# Patient Record
Sex: Female | Born: 1960
Health system: Southern US, Community
[De-identification: ages and names within clinical notes are randomized; demographics above are authoritative.]

## PROBLEM LIST (undated history)

## (undated) DIAGNOSIS — J40 Bronchitis, not specified as acute or chronic: Secondary | ICD-10-CM

## (undated) DIAGNOSIS — M542 Cervicalgia: Secondary | ICD-10-CM

## (undated) DIAGNOSIS — R51 Headache: Secondary | ICD-10-CM

## (undated) DIAGNOSIS — M1990 Unspecified osteoarthritis, unspecified site: Secondary | ICD-10-CM

## (undated) DIAGNOSIS — F329 Major depressive disorder, single episode, unspecified: Secondary | ICD-10-CM

## (undated) DIAGNOSIS — F32A Depression, unspecified: Secondary | ICD-10-CM

## (undated) DIAGNOSIS — T148XXA Other injury of unspecified body region, initial encounter: Secondary | ICD-10-CM

## (undated) DIAGNOSIS — K635 Polyp of colon: Secondary | ICD-10-CM

## (undated) DIAGNOSIS — G8929 Other chronic pain: Secondary | ICD-10-CM

## (undated) DIAGNOSIS — M549 Dorsalgia, unspecified: Secondary | ICD-10-CM

## (undated) DIAGNOSIS — F410 Panic disorder [episodic paroxysmal anxiety] without agoraphobia: Secondary | ICD-10-CM

## (undated) DIAGNOSIS — D649 Anemia, unspecified: Secondary | ICD-10-CM

## (undated) DIAGNOSIS — R519 Headache, unspecified: Secondary | ICD-10-CM

## (undated) HISTORY — DX: Depression, unspecified: F32.A

## (undated) HISTORY — PX: HEMORRHOID SURGERY: SHX153

## (undated) HISTORY — PX: ABLATION ON ENDOMETRIOSIS: SHX5787

## (undated) HISTORY — PX: POLYPECTOMY: SHX149

## (undated) HISTORY — DX: Major depressive disorder, single episode, unspecified: F32.9

## (undated) HISTORY — DX: Polyp of colon: K63.5

## (undated) HISTORY — PX: TONSILLECTOMY: SUR1361

## (undated) HISTORY — DX: Unspecified osteoarthritis, unspecified site: M19.90

## (undated) HISTORY — PX: COLONOSCOPY: SHX174

## (undated) HISTORY — PX: KIDNEY SURGERY: SHX687

## (undated) HISTORY — PX: BREAST CYST EXCISION: SHX579

## (undated) HISTORY — DX: Anemia, unspecified: D64.9

---

## 2000-12-17 HISTORY — PX: NECK SURGERY: SHX720

## 2003-11-22 ENCOUNTER — Emergency Department (HOSPITAL_COMMUNITY): Admission: EM | Admit: 2003-11-22 | Discharge: 2003-11-22 | Payer: Self-pay | Admitting: Emergency Medicine

## 2005-10-15 ENCOUNTER — Ambulatory Visit: Payer: Self-pay | Admitting: Family Medicine

## 2009-06-30 ENCOUNTER — Emergency Department (HOSPITAL_BASED_OUTPATIENT_CLINIC_OR_DEPARTMENT_OTHER): Admission: EM | Admit: 2009-06-30 | Discharge: 2009-06-30 | Payer: Self-pay | Admitting: Emergency Medicine

## 2009-06-30 ENCOUNTER — Ambulatory Visit: Payer: Self-pay | Admitting: Radiology

## 2009-07-23 ENCOUNTER — Emergency Department (HOSPITAL_BASED_OUTPATIENT_CLINIC_OR_DEPARTMENT_OTHER): Admission: EM | Admit: 2009-07-23 | Discharge: 2009-07-23 | Payer: Self-pay | Admitting: Emergency Medicine

## 2009-07-27 ENCOUNTER — Emergency Department (HOSPITAL_BASED_OUTPATIENT_CLINIC_OR_DEPARTMENT_OTHER): Admission: EM | Admit: 2009-07-27 | Discharge: 2009-07-27 | Payer: Self-pay | Admitting: Emergency Medicine

## 2009-09-24 ENCOUNTER — Emergency Department (HOSPITAL_BASED_OUTPATIENT_CLINIC_OR_DEPARTMENT_OTHER): Admission: EM | Admit: 2009-09-24 | Discharge: 2009-09-24 | Payer: Self-pay | Admitting: Emergency Medicine

## 2009-10-19 ENCOUNTER — Emergency Department (HOSPITAL_BASED_OUTPATIENT_CLINIC_OR_DEPARTMENT_OTHER): Admission: EM | Admit: 2009-10-19 | Discharge: 2009-10-19 | Payer: Self-pay | Admitting: Emergency Medicine

## 2009-12-08 ENCOUNTER — Emergency Department (HOSPITAL_BASED_OUTPATIENT_CLINIC_OR_DEPARTMENT_OTHER): Admission: EM | Admit: 2009-12-08 | Discharge: 2009-12-08 | Payer: Self-pay | Admitting: Emergency Medicine

## 2010-02-18 ENCOUNTER — Emergency Department (HOSPITAL_BASED_OUTPATIENT_CLINIC_OR_DEPARTMENT_OTHER): Admission: EM | Admit: 2010-02-18 | Discharge: 2010-02-18 | Payer: Self-pay | Admitting: Emergency Medicine

## 2010-06-10 ENCOUNTER — Emergency Department (HOSPITAL_BASED_OUTPATIENT_CLINIC_OR_DEPARTMENT_OTHER): Admission: EM | Admit: 2010-06-10 | Discharge: 2010-06-10 | Payer: Self-pay | Admitting: Emergency Medicine

## 2010-08-01 ENCOUNTER — Emergency Department (HOSPITAL_BASED_OUTPATIENT_CLINIC_OR_DEPARTMENT_OTHER): Admission: EM | Admit: 2010-08-01 | Discharge: 2010-08-01 | Payer: Self-pay | Admitting: Emergency Medicine

## 2010-11-26 ENCOUNTER — Emergency Department (HOSPITAL_BASED_OUTPATIENT_CLINIC_OR_DEPARTMENT_OTHER)
Admission: EM | Admit: 2010-11-26 | Discharge: 2010-11-26 | Payer: Self-pay | Source: Home / Self Care | Admitting: Emergency Medicine

## 2010-12-06 ENCOUNTER — Emergency Department (HOSPITAL_BASED_OUTPATIENT_CLINIC_OR_DEPARTMENT_OTHER)
Admission: EM | Admit: 2010-12-06 | Discharge: 2010-12-06 | Payer: Self-pay | Source: Home / Self Care | Admitting: Emergency Medicine

## 2010-12-28 ENCOUNTER — Emergency Department (HOSPITAL_BASED_OUTPATIENT_CLINIC_OR_DEPARTMENT_OTHER)
Admission: EM | Admit: 2010-12-28 | Discharge: 2010-12-28 | Payer: Self-pay | Source: Home / Self Care | Admitting: Emergency Medicine

## 2011-01-01 LAB — URINE MICROSCOPIC-ADD ON

## 2011-01-01 LAB — DIFFERENTIAL
Basophils Absolute: 0 10*3/uL (ref 0.0–0.1)
Basophils Relative: 0 % (ref 0–1)
Eosinophils Absolute: 0.1 10*3/uL (ref 0.0–0.7)
Eosinophils Relative: 2 % (ref 0–5)
Lymphocytes Relative: 31 % (ref 12–46)
Lymphs Abs: 2.6 10*3/uL (ref 0.7–4.0)
Monocytes Absolute: 0.5 10*3/uL (ref 0.1–1.0)
Monocytes Relative: 6 % (ref 3–12)
Neutro Abs: 5.2 10*3/uL (ref 1.7–7.7)
Neutrophils Relative %: 61 % (ref 43–77)

## 2011-01-01 LAB — CBC
HCT: 47.5 % — ABNORMAL HIGH (ref 36.0–46.0)
Hemoglobin: 16.6 g/dL — ABNORMAL HIGH (ref 12.0–15.0)
MCH: 30.5 pg (ref 26.0–34.0)
MCHC: 34.9 g/dL (ref 30.0–36.0)
MCV: 87.3 fL (ref 78.0–100.0)
Platelets: 295 10*3/uL (ref 150–400)
RBC: 5.44 MIL/uL — ABNORMAL HIGH (ref 3.87–5.11)
RDW: 16.4 % — ABNORMAL HIGH (ref 11.5–15.5)
WBC: 8.5 10*3/uL (ref 4.0–10.5)

## 2011-01-01 LAB — BASIC METABOLIC PANEL
BUN: 23 mg/dL (ref 6–23)
CO2: 22 mEq/L (ref 19–32)
Calcium: 10.5 mg/dL (ref 8.4–10.5)
Chloride: 110 mEq/L (ref 96–112)
Creatinine, Ser: 0.8 mg/dL (ref 0.4–1.2)
GFR calc Af Amer: >60 mL/min (ref >60)
GFR calc non Af Amer: >60 mL/min (ref >60)
Glucose, Bld: 95 mg/dL (ref 70–99)
Potassium: 5 mEq/L (ref 3.5–5.1)
Sodium: 148 mEq/L — ABNORMAL HIGH (ref 135–145)

## 2011-01-01 LAB — URINALYSIS, ROUTINE W REFLEX MICROSCOPIC
Bilirubin Urine: NEGATIVE
Hgb urine dipstick: NEGATIVE
Ketones, ur: NEGATIVE mg/dL
Nitrite: NEGATIVE
Protein, ur: NEGATIVE mg/dL
Specific Gravity, Urine: 1.026 (ref 1.005–1.030)
Urine Glucose, Fasting: NEGATIVE mg/dL
Urobilinogen, UA: 0.2 mg/dL (ref 0.0–1.0)
pH: 6.5 (ref 5.0–8.0)

## 2011-01-01 LAB — LIPASE, BLOOD: Lipase: 239 U/L (ref 23–300)

## 2011-01-11 LAB — URINE CULTURE
Colony Count: 65000
Culture  Setup Time: 201201130112

## 2011-02-22 ENCOUNTER — Emergency Department (HOSPITAL_BASED_OUTPATIENT_CLINIC_OR_DEPARTMENT_OTHER)
Admission: EM | Admit: 2011-02-22 | Discharge: 2011-02-22 | Disposition: A | Discharge status: Home / Self Care | Payer: BC Managed Care – PPO | Attending: Emergency Medicine | Admitting: Emergency Medicine

## 2011-02-22 DIAGNOSIS — F17200 Nicotine dependence, unspecified, uncomplicated: Secondary | ICD-10-CM | POA: Insufficient documentation

## 2011-02-22 DIAGNOSIS — IMO0002 Reserved for concepts with insufficient information to code with codable children: Secondary | ICD-10-CM | POA: Insufficient documentation

## 2011-02-22 DIAGNOSIS — G8929 Other chronic pain: Secondary | ICD-10-CM | POA: Insufficient documentation

## 2011-02-22 DIAGNOSIS — M542 Cervicalgia: Secondary | ICD-10-CM | POA: Insufficient documentation

## 2011-02-26 LAB — URINALYSIS, ROUTINE W REFLEX MICROSCOPIC
Glucose, UA: NEGATIVE mg/dL
Hgb urine dipstick: NEGATIVE
Ketones, ur: 15 mg/dL — AB
Nitrite: NEGATIVE
Protein, ur: 30 mg/dL — AB
Specific Gravity, Urine: 1.027 (ref 1.005–1.030)
Urobilinogen, UA: 1 mg/dL (ref 0.0–1.0)
pH: 6 (ref 5.0–8.0)

## 2011-02-26 LAB — POCT I-STAT 3, ART BLOOD GAS (G3+)
Acid-Base Excess: 2 mmol/L (ref 0.0–2.0)
Bicarbonate: 29.1 mEq/L — ABNORMAL HIGH (ref 20.0–24.0)
O2 Saturation: 90 %
Patient temperature: 98.3
TCO2: 31 mmol/L (ref 0–100)
pCO2 arterial: 55.6 mmHg — ABNORMAL HIGH (ref 35.0–45.0)
pH, Arterial: 7.326 — ABNORMAL LOW (ref 7.350–7.400)
pO2, Arterial: 63 mmHg — ABNORMAL LOW (ref 80.0–100.0)

## 2011-02-26 LAB — URINE MICROSCOPIC-ADD ON

## 2011-02-27 LAB — COMPREHENSIVE METABOLIC PANEL
ALT: 28 U/L (ref 0–35)
AST: 25 U/L (ref 0–37)
Albumin: 3.6 g/dL (ref 3.5–5.2)
Alkaline Phosphatase: 74 U/L (ref 39–117)
BUN: 8 mg/dL (ref 6–23)
CO2: 24 mEq/L (ref 19–32)
Calcium: 9.7 mg/dL (ref 8.4–10.5)
Chloride: 114 mEq/L — ABNORMAL HIGH (ref 96–112)
Creatinine, Ser: 0.8 mg/dL (ref 0.4–1.2)
GFR calc Af Amer: >60 mL/min (ref >60)
GFR calc non Af Amer: >60 mL/min (ref >60)
Glucose, Bld: 82 mg/dL (ref 70–99)
Potassium: 3.4 mEq/L — ABNORMAL LOW (ref 3.5–5.1)
Sodium: 147 mEq/L — ABNORMAL HIGH (ref 135–145)
Total Bilirubin: 0.5 mg/dL (ref 0.3–1.2)
Total Protein: 6.1 g/dL (ref 6.0–8.3)

## 2011-02-27 LAB — URINALYSIS, ROUTINE W REFLEX MICROSCOPIC
Bilirubin Urine: NEGATIVE
Glucose, UA: NEGATIVE mg/dL
Ketones, ur: NEGATIVE mg/dL
Nitrite: NEGATIVE
Protein, ur: NEGATIVE mg/dL
Specific Gravity, Urine: 1.017 (ref 1.005–1.030)
Urobilinogen, UA: 1 mg/dL (ref 0.0–1.0)
pH: 6.5 (ref 5.0–8.0)

## 2011-02-27 LAB — CBC
HCT: 40.6 % (ref 36.0–46.0)
Hemoglobin: 13.7 g/dL (ref 12.0–15.0)
MCH: 30.1 pg (ref 26.0–34.0)
MCHC: 33.7 g/dL (ref 30.0–36.0)
MCV: 89.2 fL (ref 78.0–100.0)
Platelets: 255 10*3/uL (ref 150–400)
RBC: 4.55 MIL/uL (ref 3.87–5.11)
RDW: 16.1 % — ABNORMAL HIGH (ref 11.5–15.5)
WBC: 7.4 10*3/uL (ref 4.0–10.5)

## 2011-02-27 LAB — URINE MICROSCOPIC-ADD ON

## 2011-02-27 LAB — DIFFERENTIAL
Basophils Absolute: 0 10*3/uL (ref 0.0–0.1)
Basophils Relative: 0 % (ref 0–1)
Eosinophils Absolute: 0.3 10*3/uL (ref 0.0–0.7)
Eosinophils Relative: 4 % (ref 0–5)
Lymphocytes Relative: 22 % (ref 12–46)
Lymphs Abs: 1.6 10*3/uL (ref 0.7–4.0)
Monocytes Absolute: 0.4 10*3/uL (ref 0.1–1.0)
Monocytes Relative: 5 % (ref 3–12)
Neutro Abs: 5 10*3/uL (ref 1.7–7.7)
Neutrophils Relative %: 68 % (ref 43–77)

## 2011-02-27 LAB — LIPASE, BLOOD: Lipase: 119 U/L (ref 23–300)

## 2011-03-24 LAB — CBC
HCT: 40.2 % (ref 36.0–46.0)
Hemoglobin: 13.8 g/dL (ref 12.0–15.0)
MCHC: 34.4 g/dL (ref 30.0–36.0)
MCV: 92.6 fL (ref 78.0–100.0)
Platelets: 186 10*3/uL (ref 150–400)
RBC: 4.34 MIL/uL (ref 3.87–5.11)
RDW: 13.8 % (ref 11.5–15.5)
WBC: 6.2 10*3/uL (ref 4.0–10.5)

## 2011-03-24 LAB — POCT TOXICOLOGY PANEL: Opiates: POSITIVE

## 2011-03-24 LAB — DIFFERENTIAL
Basophils Absolute: 0 10*3/uL (ref 0.0–0.1)
Basophils Relative: 1 % (ref 0–1)
Eosinophils Absolute: 0.2 10*3/uL (ref 0.0–0.7)
Eosinophils Relative: 3 % (ref 0–5)
Lymphocytes Relative: 37 % (ref 12–46)
Lymphs Abs: 2.3 10*3/uL (ref 0.7–4.0)
Monocytes Absolute: 0.5 10*3/uL (ref 0.1–1.0)
Monocytes Relative: 8 % (ref 3–12)
Neutro Abs: 3.2 10*3/uL (ref 1.7–7.7)
Neutrophils Relative %: 52 % (ref 43–77)

## 2011-03-24 LAB — URINALYSIS, ROUTINE W REFLEX MICROSCOPIC
Glucose, UA: NEGATIVE mg/dL
Ketones, ur: NEGATIVE mg/dL
Protein, ur: NEGATIVE mg/dL

## 2011-03-24 LAB — COMPREHENSIVE METABOLIC PANEL
ALT: 8 U/L (ref 0–35)
AST: 25 U/L (ref 0–37)
Albumin: 3.7 g/dL (ref 3.5–5.2)
Alkaline Phosphatase: 72 U/L (ref 39–117)
BUN: 15 mg/dL (ref 6–23)
CO2: 27 mEq/L (ref 19–32)
Calcium: 9.5 mg/dL (ref 8.4–10.5)
Chloride: 110 mEq/L (ref 96–112)
Creatinine, Ser: 0.9 mg/dL (ref 0.4–1.2)
GFR calc Af Amer: >60 mL/min (ref >60)
GFR calc non Af Amer: >60 mL/min (ref >60)
Glucose, Bld: 65 mg/dL — ABNORMAL LOW (ref 70–99)
Potassium: 4.4 mEq/L (ref 3.5–5.1)
Sodium: 142 mEq/L (ref 135–145)
Total Bilirubin: 0.3 mg/dL (ref 0.3–1.2)
Total Protein: 6.3 g/dL (ref 6.0–8.3)

## 2011-03-24 LAB — URINE MICROSCOPIC-ADD ON

## 2011-03-24 LAB — ETHANOL: Alcohol, Ethyl (B): <5 mg/dL (ref 0–10)

## 2011-03-24 LAB — ACETAMINOPHEN LEVEL
Acetaminophen (Tylenol), Serum: 19 ug/mL (ref 10–30)
Acetaminophen (Tylenol), Serum: <10 ug/mL — ABNORMAL LOW (ref 10–30)

## 2011-05-20 ENCOUNTER — Emergency Department (HOSPITAL_BASED_OUTPATIENT_CLINIC_OR_DEPARTMENT_OTHER)
Admission: EM | Admit: 2011-05-20 | Discharge: 2011-05-20 | Disposition: A | Discharge status: Home / Self Care | Payer: BC Managed Care – PPO | Attending: Emergency Medicine | Admitting: Emergency Medicine

## 2011-05-20 DIAGNOSIS — G8929 Other chronic pain: Secondary | ICD-10-CM | POA: Insufficient documentation

## 2011-05-20 DIAGNOSIS — R112 Nausea with vomiting, unspecified: Secondary | ICD-10-CM | POA: Insufficient documentation

## 2011-05-20 DIAGNOSIS — IMO0002 Reserved for concepts with insufficient information to code with codable children: Secondary | ICD-10-CM | POA: Insufficient documentation

## 2011-05-20 LAB — BASIC METABOLIC PANEL
CO2: 22 mEq/L (ref 19–32)
Calcium: 10.5 mg/dL (ref 8.4–10.5)
Chloride: 105 mEq/L (ref 96–112)
Glucose, Bld: 83 mg/dL (ref 70–99)
Potassium: 3.8 mEq/L (ref 3.5–5.1)
Sodium: 141 mEq/L (ref 135–145)

## 2011-05-20 LAB — URINALYSIS, ROUTINE W REFLEX MICROSCOPIC
Ketones, ur: 15 mg/dL — AB
Nitrite: NEGATIVE
Urobilinogen, UA: 1 mg/dL (ref 0.0–1.0)
pH: 6 (ref 5.0–8.0)

## 2011-05-20 LAB — URINE MICROSCOPIC-ADD ON

## 2011-07-16 ENCOUNTER — Emergency Department (HOSPITAL_BASED_OUTPATIENT_CLINIC_OR_DEPARTMENT_OTHER)
Admission: EM | Admit: 2011-07-16 | Discharge: 2011-07-16 | Disposition: A | Discharge status: Home / Self Care | Payer: BC Managed Care – PPO | Attending: Emergency Medicine | Admitting: Emergency Medicine

## 2011-07-16 ENCOUNTER — Encounter: Payer: Self-pay | Admitting: *Deleted

## 2011-07-16 DIAGNOSIS — R109 Unspecified abdominal pain: Secondary | ICD-10-CM | POA: Insufficient documentation

## 2011-07-16 DIAGNOSIS — R197 Diarrhea, unspecified: Secondary | ICD-10-CM

## 2011-07-16 DIAGNOSIS — R112 Nausea with vomiting, unspecified: Secondary | ICD-10-CM | POA: Insufficient documentation

## 2011-07-16 DIAGNOSIS — G8929 Other chronic pain: Secondary | ICD-10-CM | POA: Insufficient documentation

## 2011-07-16 HISTORY — DX: Other chronic pain: G89.29

## 2011-07-16 HISTORY — DX: Cervicalgia: M54.2

## 2011-07-16 LAB — COMPREHENSIVE METABOLIC PANEL
ALT: 16 U/L (ref 0–35)
AST: 21 U/L (ref 0–37)
Alkaline Phosphatase: 99 U/L (ref 39–117)
CO2: 23 mEq/L (ref 19–32)
GFR calc Af Amer: >60 mL/min (ref >60)
GFR calc non Af Amer: >60 mL/min (ref >60)
Glucose, Bld: 88 mg/dL (ref 70–99)
Potassium: 3.8 mEq/L (ref 3.5–5.1)
Sodium: 140 mEq/L (ref 135–145)

## 2011-07-16 LAB — URINALYSIS, ROUTINE W REFLEX MICROSCOPIC
Bilirubin Urine: NEGATIVE
Glucose, UA: NEGATIVE mg/dL
Hgb urine dipstick: NEGATIVE
Specific Gravity, Urine: 1.03 (ref 1.005–1.030)
pH: 6 (ref 5.0–8.0)

## 2011-07-16 LAB — CBC
Hemoglobin: 15.6 g/dL — ABNORMAL HIGH (ref 12.0–15.0)
Platelets: 384 10*3/uL (ref 150–400)
RBC: 5.12 MIL/uL — ABNORMAL HIGH (ref 3.87–5.11)
WBC: 10.6 10*3/uL — ABNORMAL HIGH (ref 4.0–10.5)

## 2011-07-16 LAB — URINE MICROSCOPIC-ADD ON

## 2011-07-16 MED ORDER — ONDANSETRON HCL 4 MG/2ML IJ SOLN
4.0000 mg | Freq: Once | INTRAMUSCULAR | Status: AC
  Administered 2011-07-16: 4 mg via INTRAVENOUS
  Filled 2011-07-16: qty 2

## 2011-07-16 MED ORDER — DIPHENOXYLATE-ATROPINE 2.5-0.025 MG PO TABS
2.0000 | ORAL_TABLET | Freq: Once | ORAL | Status: AC
  Administered 2011-07-16: 2 via ORAL
  Filled 2011-07-16: qty 2

## 2011-07-16 MED ORDER — SODIUM CHLORIDE 0.9 % IV BOLUS (SEPSIS): 1000.0000 mL | Freq: Once | INTRAVENOUS | Status: DC

## 2011-07-16 MED ORDER — SODIUM CHLORIDE 0.9 % IV BOLUS (SEPSIS)
1000.0000 mL | Freq: Once | INTRAVENOUS | Status: AC
  Administered 2011-07-16: 1000 mL via INTRAVENOUS

## 2011-07-16 MED ORDER — ONDANSETRON HCL 4 MG PO TABS: 4.0000 mg | ORAL_TABLET | Freq: Four times a day (QID) | ORAL | Status: AC

## 2011-07-16 NOTE — ED Notes (Signed)
Pt c/o loose stools x 4 days with nausea.

## 2011-07-16 NOTE — ED Provider Notes (Addendum)
History     Chief Complaint  Patient presents with  . Diarrhea   Patient is a 50 y.o. female presenting with diarrhea. The history is provided by the patient.  Diarrhea The primary symptoms include fever, fatigue, abdominal pain, nausea, vomiting, diarrhea and myalgias. The illness began 3 to 5 days ago. The onset was gradual. The problem has not changed since onset. The abdominal pain began more than 2 days ago. The abdominal pain is generalized. The abdominal pain does not radiate. The severity of the abdominal pain is 2/10.  Nausea began 3 to 5 days ago. The nausea is associated with eating. The nausea is exacerbated by food.  The diarrhea began 3 to 5 days ago. The diarrhea is watery. The diarrhea occurs 2 to 4 times per day.  The illness is also significant for chills. Associated medical issues do not include inflammatory bowel disease, GERD, alcohol abuse or irritable bowel syndrome.    Past Medical History  Diagnosis Date  . Neck pain, chronic     Past Surgical History  Procedure Date  . Neck surgery   . Tonsillectomy     History reviewed. No pertinent family history.  History  Substance Use Topics  . Smoking status: Current Everyday Smoker -- 1.0 packs/day  . Smokeless tobacco: Not on file  . Alcohol Use: No    OB History    Grav Para Term Preterm Abortions TAB SAB Ect Mult Living                  Review of Systems  Constitutional: Positive for fever, chills and fatigue.  Gastrointestinal: Positive for nausea, vomiting, abdominal pain and diarrhea.  Musculoskeletal: Positive for myalgias.  All other systems reviewed and are negative.    Physical Exam  BP 109/58  Pulse 70  Temp(Src) 98.4 F (36.9 C) (Oral)  Resp 16  Wt 120 lb (54.432 kg)  SpO2 99%  Physical Exam  Constitutional: She is oriented to person, place, and time. She appears well-developed and well-nourished.  HENT:  Head: Normocephalic and atraumatic.  Eyes: Conjunctivae and EOM are  normal. Pupils are equal, round, and reactive to light.  Neck: Normal range of motion. Neck supple.  Cardiovascular: Normal rate.   Pulmonary/Chest: Effort normal and breath sounds normal.  Abdominal: Soft. Bowel sounds are normal. She exhibits no distension and no mass. There is no tenderness. There is no rebound and no guarding.  Musculoskeletal: Normal range of motion.  Neurological: She is alert and oriented to person, place, and time. She has normal reflexes.  Skin: Skin is warm and dry.  Psychiatric: She has a normal mood and affect.    ED Course  Procedures  MDM Pt feels better after fluids and medications.  I advised lomotil x 24 hours,  Zofran po,  Encourage fluids,  Brat diet.  Medical screening examination/treatment/procedure(s) were performed by non-physician practitioner and as supervising physician I was immediately available for consultation/collaboration. Osvaldo Human, M.D.    [place], [place] 07/16/11 2113  Carleene Cooper III, MD 07/17/11 1050  Carleene Cooper III, MD 08/24/11 [phone]

## 2011-07-16 NOTE — ED Notes (Signed)
Reviewed case with Verline Lema, PA and agreeable to UA and NS 1000cc bolus-pt requested pain med-advised of need to be seen by EDP prior to med admn

## 2011-07-16 NOTE — ED Notes (Signed)
Pt denies n/v/d since meds given-states she feels better and ready to go

## 2011-10-12 ENCOUNTER — Encounter (HOSPITAL_BASED_OUTPATIENT_CLINIC_OR_DEPARTMENT_OTHER): Payer: Self-pay | Admitting: Family Medicine

## 2011-10-12 ENCOUNTER — Emergency Department (HOSPITAL_BASED_OUTPATIENT_CLINIC_OR_DEPARTMENT_OTHER)
Admission: EM | Admit: 2011-10-12 | Discharge: 2011-10-12 | Disposition: A | Discharge status: Home / Self Care | Payer: BC Managed Care – PPO | Attending: Emergency Medicine | Admitting: Emergency Medicine

## 2011-10-12 DIAGNOSIS — R51 Headache: Secondary | ICD-10-CM

## 2011-10-12 DIAGNOSIS — F17200 Nicotine dependence, unspecified, uncomplicated: Secondary | ICD-10-CM | POA: Insufficient documentation

## 2011-10-12 DIAGNOSIS — R197 Diarrhea, unspecified: Secondary | ICD-10-CM | POA: Insufficient documentation

## 2011-10-12 HISTORY — DX: Other injury of unspecified body region, initial encounter: T14.8XXA

## 2011-10-12 HISTORY — DX: Headache: R51

## 2011-10-12 HISTORY — DX: Headache, unspecified: R51.9

## 2011-10-12 HISTORY — DX: Other chronic pain: G89.29

## 2011-10-12 LAB — BASIC METABOLIC PANEL
BUN: 10 mg/dL (ref 6–23)
Calcium: 10.3 mg/dL (ref 8.4–10.5)
Chloride: 107 mEq/L (ref 96–112)
Creatinine, Ser: 0.6 mg/dL (ref 0.50–1.10)
GFR calc Af Amer: >90 mL/min (ref >90)
GFR calc non Af Amer: >90 mL/min (ref >90)

## 2011-10-12 LAB — DIFFERENTIAL
Basophils Absolute: 0 10*3/uL (ref 0.0–0.1)
Basophils Relative: 0 % (ref 0–1)
Eosinophils Absolute: 0.1 10*3/uL (ref 0.0–0.7)
Monocytes Absolute: 0.6 10*3/uL (ref 0.1–1.0)
Monocytes Relative: 6 % (ref 3–12)
Neutro Abs: 5.9 10*3/uL (ref 1.7–7.7)
Neutrophils Relative %: 59 % (ref 43–77)

## 2011-10-12 LAB — CBC
HCT: 43.6 % (ref 36.0–46.0)
Hemoglobin: 15.2 g/dL — ABNORMAL HIGH (ref 12.0–15.0)
MCH: 30.2 pg (ref 26.0–34.0)
MCHC: 34.9 g/dL (ref 30.0–36.0)
RDW: 16.4 % — ABNORMAL HIGH (ref 11.5–15.5)

## 2011-10-12 MED ORDER — DIPHENHYDRAMINE HCL 50 MG/ML IJ SOLN
25.0000 mg | Freq: Once | INTRAMUSCULAR | Status: DC
  Filled 2011-10-12: qty 1

## 2011-10-12 MED ORDER — DROPERIDOL 2.5 MG/ML IJ SOLN
2.5000 mg | Freq: Once | INTRAMUSCULAR | Status: DC
  Filled 2011-10-12: qty 2

## 2011-10-12 MED ORDER — SODIUM CHLORIDE 0.9 % IV BOLUS (SEPSIS)
1000.0000 mL | Freq: Once | INTRAVENOUS | Status: AC
  Administered 2011-10-12: 1000 mL via INTRAVENOUS

## 2011-10-12 MED ORDER — OXYCODONE-ACETAMINOPHEN 5-325 MG PO TABS: 2.0000 | ORAL_TABLET | ORAL | Status: AC | PRN

## 2011-10-12 NOTE — ED Provider Notes (Addendum)
History     CSN: [id number] Arrival date & time: 10/12/2011  5:05 PM   First MD Initiated Contact with Patient 10/12/11 1741      Chief Complaint  Patient presents with  . Headache  . Diarrhea     HPI Pt c/o diarrhea and headaches x 1 wk. Pt reports h/o headaches in past and sts h/a feels similar to ones in past.  Past Medical History  Diagnosis Date  . Neck pain, chronic   . Chronic headaches   . Nerve damage     Past Surgical History  Procedure Date  . Neck surgery   . Tonsillectomy     No family history on file.  History  Substance Use Topics  . Smoking status: Current Everyday Smoker -- 1.0 packs/day  . Smokeless tobacco: Not on file  . Alcohol Use: No    OB History    Grav Para Term Preterm Abortions TAB SAB Ect Mult Living                  Review of Systems  All other systems reviewed and are negative.    Allergies  Anaprox  Home Medications   Current Outpatient Rx  Name Route Sig Dispense Refill  . DULOXETINE HCL 60 MG PO CPEP Oral Take 60 mg by mouth daily.      . IBUPROFEN 200 MG PO TABS Oral Take 400 mg by mouth every 6 (six) hours as needed. For pain     . LIDOCAINE 5 % EX PTCH Transdermal Place 1 patch onto the skin daily. Remove & Discard patch within 12 hours or as directed by MD     . TIZANIDINE HCL 2 MG PO CAPS Oral Take 2 mg by mouth 3 (three) times daily.      . OXYCODONE-ACETAMINOPHEN 5-325 MG PO TABS Oral Take 2 tablets by mouth every 4 (four) hours as needed for pain. 6 tablet 0    BP 113/88  Pulse 72  Temp(Src) 98.2 F (36.8 C) (Oral)  Resp 16  SpO2 99%  Physical Exam  HENT:  Head: Normocephalic.  Eyes: Pupils are equal, round, and reactive to light.  Neck: Normal range of motion. No thyromegaly present.  Cardiovascular: Normal rate.   Pulmonary/Chest: Effort normal.  Abdominal: Soft. Bowel sounds are normal. She exhibits no mass. There is no rebound and no guarding.  Skin: Skin is warm and dry.  Psychiatric: She  has a normal mood and affect.    ED Course  Procedures (including critical care time) Patient given 1 L of IV fluids.  Was unable to give other medication the patient she was driving her self.  No real change in her headache has had no significant diarrhea. Labs Reviewed  CBC - Abnormal; Notable for the following:    Hemoglobin 15.2 (*)    RDW 16.4 (*)    All other components within normal limits  DIFFERENTIAL  BASIC METABOLIC PANEL   No results found.   1. Headache   2. Diarrhea       MDM          Nelia Shi, MD 10/12/11 2039  Nelia Shi, MD 10/12/11 2040

## 2011-10-12 NOTE — ED Notes (Signed)
Pt states doesn't have a ride home,meds withheld and discussed with Dr Radford Pax

## 2011-10-12 NOTE — ED Notes (Signed)
Pt c/o diarrhea and headaches x 1 wk. Pt reports h/o headaches in past and sts h/a feels similar to ones in past.

## 2012-02-01 ENCOUNTER — Emergency Department (HOSPITAL_BASED_OUTPATIENT_CLINIC_OR_DEPARTMENT_OTHER)
Admission: EM | Admit: 2012-02-01 | Discharge: 2012-02-01 | Disposition: A | Discharge status: Home / Self Care | Payer: BC Managed Care – PPO | Attending: Emergency Medicine | Admitting: Emergency Medicine

## 2012-02-01 ENCOUNTER — Other Ambulatory Visit: Payer: Self-pay

## 2012-02-01 ENCOUNTER — Encounter (HOSPITAL_BASED_OUTPATIENT_CLINIC_OR_DEPARTMENT_OTHER): Payer: Self-pay | Admitting: *Deleted

## 2012-02-01 DIAGNOSIS — R42 Dizziness and giddiness: Secondary | ICD-10-CM | POA: Insufficient documentation

## 2012-02-01 DIAGNOSIS — N390 Urinary tract infection, site not specified: Secondary | ICD-10-CM | POA: Insufficient documentation

## 2012-02-01 DIAGNOSIS — R197 Diarrhea, unspecified: Secondary | ICD-10-CM

## 2012-02-01 DIAGNOSIS — G8929 Other chronic pain: Secondary | ICD-10-CM | POA: Insufficient documentation

## 2012-02-01 DIAGNOSIS — F17200 Nicotine dependence, unspecified, uncomplicated: Secondary | ICD-10-CM | POA: Insufficient documentation

## 2012-02-01 LAB — URINALYSIS, ROUTINE W REFLEX MICROSCOPIC
Ketones, ur: 15 mg/dL — AB
Nitrite: NEGATIVE
Protein, ur: 30 mg/dL — AB
Urobilinogen, UA: 1 mg/dL (ref 0.0–1.0)

## 2012-02-01 LAB — COMPREHENSIVE METABOLIC PANEL
Alkaline Phosphatase: 94 U/L (ref 39–117)
BUN: 15 mg/dL (ref 6–23)
Chloride: 107 mEq/L (ref 96–112)
Creatinine, Ser: 0.7 mg/dL (ref 0.50–1.10)
GFR calc Af Amer: >90 mL/min (ref >90)
Glucose, Bld: 95 mg/dL (ref 70–99)
Potassium: 3.5 mEq/L (ref 3.5–5.1)
Total Bilirubin: 0.2 mg/dL — ABNORMAL LOW (ref 0.3–1.2)

## 2012-02-01 LAB — CBC
HCT: 45 % (ref 36.0–46.0)
Hemoglobin: 15.9 g/dL — ABNORMAL HIGH (ref 12.0–15.0)
MCH: 30.8 pg (ref 26.0–34.0)
MCHC: 35.3 g/dL (ref 30.0–36.0)
RBC: 5.17 MIL/uL — ABNORMAL HIGH (ref 3.87–5.11)

## 2012-02-01 LAB — LIPASE, BLOOD: Lipase: 35 U/L (ref 11–59)

## 2012-02-01 LAB — DIFFERENTIAL
Eosinophils Absolute: 0 10*3/uL (ref 0.0–0.7)
Lymphs Abs: 2.5 10*3/uL (ref 0.7–4.0)
Monocytes Absolute: 0.5 10*3/uL (ref 0.1–1.0)
Monocytes Relative: 6 % (ref 3–12)
Neutro Abs: 5.5 10*3/uL (ref 1.7–7.7)
Neutrophils Relative %: 64 % (ref 43–77)

## 2012-02-01 LAB — URINE MICROSCOPIC-ADD ON

## 2012-02-01 MED ORDER — ONDANSETRON HCL 4 MG PO TABS: 4.0000 mg | ORAL_TABLET | Freq: Four times a day (QID) | ORAL | Status: AC

## 2012-02-01 MED ORDER — ONDANSETRON HCL 4 MG/2ML IJ SOLN
4.0000 mg | Freq: Once | INTRAMUSCULAR | Status: AC
  Administered 2012-02-01: 4 mg via INTRAVENOUS
  Filled 2012-02-01: qty 2

## 2012-02-01 MED ORDER — SODIUM CHLORIDE 0.9 % IV BOLUS (SEPSIS)
1000.0000 mL | Freq: Once | INTRAVENOUS | Status: AC
  Administered 2012-02-01: 1000 mL via INTRAVENOUS

## 2012-02-01 NOTE — ED Notes (Signed)
Diarrhea x 1 week. Headache and dizziness. Here today with dizziness. Diarrhea continues. States she is having 8 or 9 loose stools per day.

## 2012-02-01 NOTE — ED Provider Notes (Signed)
History     CSN: [id number]  Arrival date & time 02/01/12  1632   First MD Initiated Contact with Patient 02/01/12 1710      Chief Complaint  Patient presents with  . Dizziness    (Consider location/radiation/quality/duration/timing/severity/associated sxs/prior treatment) HPI Comments: Patient presents with diarrhea, lower dental pain loose stools for the past one week. She also endorses gradual onset headache with lightheadedness upon standing. She's had 8 or 9 stools daily without blood. She's had no vomiting. She has no fever, urinary symptoms, vaginal symptoms, chest pain or shortness of breath. She's had ER visits in the past for diarrhea. She admits to good by mouth intake and urine output.reports a normal colonoscopy with polyps. She does not have any family history of inflammatory bowel disease.  The history is provided by the patient.    Past Medical History  Diagnosis Date  . Neck pain, chronic   . Chronic headaches   . Nerve damage     Past Surgical History  Procedure Date  . Neck surgery   . Tonsillectomy     No family history on file.  History  Substance Use Topics  . Smoking status: Current Everyday Smoker -- 1.0 packs/day  . Smokeless tobacco: Not on file  . Alcohol Use: No    OB History    Grav Para Term Preterm Abortions TAB SAB Ect Mult Living                  Review of Systems  Constitutional: Positive for activity change and appetite change. Negative for fever.  HENT: Negative for congestion and rhinorrhea.   Respiratory: Negative for cough, chest tightness and shortness of breath.   Cardiovascular: Negative for chest pain.  Gastrointestinal: Positive for abdominal pain and diarrhea. Negative for nausea and vomiting.  Genitourinary: Negative for dysuria and hematuria.  Musculoskeletal: Positive for myalgias and arthralgias.  Neurological: Positive for dizziness and light-headedness. Negative for weakness and headaches.    Allergies    Anaprox  Home Medications   Current Outpatient Rx  Name Route Sig Dispense Refill  . DULOXETINE HCL 60 MG PO CPEP Oral Take 60 mg by mouth daily.      . IBUPROFEN 200 MG PO TABS Oral Take 400 mg by mouth every 6 (six) hours as needed. For pain     . LIDOCAINE 5 % EX PTCH Transdermal Place 1 patch onto the skin daily. Remove & Discard patch within 12 hours or as directed by MD     . TIZANIDINE HCL 2 MG PO CAPS Oral Take 2 mg by mouth 3 (three) times daily.      Marland Kitchen CIPROFLOXACIN HCL 500 MG PO TABS Oral Take 1 tablet (500 mg total) by mouth 2 (two) times daily. 20 tablet 0  . ONDANSETRON HCL 4 MG PO TABS Oral Take 1 tablet (4 mg total) by mouth every 6 (six) hours. 12 tablet 0    BP 109/66  Pulse 77  Temp(Src) 98.3 F (36.8 C) (Oral)  Resp 16  SpO2 96%  Physical Exam  Constitutional: She is oriented to person, place, and time. She appears well-developed and well-nourished. No distress.  HENT:  Head: Normocephalic and atraumatic.  Mouth/Throat: Oropharynx is clear and moist. No oropharyngeal exudate.  Eyes: Conjunctivae and EOM are normal. Pupils are equal, round, and reactive to light.  Neck: Normal range of motion.  Cardiovascular: Normal rate, regular rhythm and normal heart sounds.   Pulmonary/Chest: Effort normal and breath sounds normal. No  respiratory distress.  Abdominal: Soft. There is no tenderness. There is no rebound and no guarding.  Musculoskeletal: Normal range of motion. She exhibits no edema and no tenderness.  Neurological: She is alert and oriented to person, place, and time. No cranial nerve deficit.       Cranial nerves II through XII intact, 5 out of 5 strength throughout, no ataxia finger to nose, no nystagmus, normal gait, Romberg negative. Head impulse testing wnl.   Skin: Skin is warm.    ED Course  Procedures (including critical care time)  Labs Reviewed  CBC - Abnormal; Notable for the following:    RBC 5.17 (*)    Hemoglobin 15.9 (*)    RDW 16.6  (*)    All other components within normal limits  COMPREHENSIVE METABOLIC PANEL - Abnormal; Notable for the following:    Calcium 10.6 (*)    Total Bilirubin 0.2 (*)    All other components within normal limits  URINALYSIS, ROUTINE W REFLEX MICROSCOPIC - Abnormal; Notable for the following:    Color, Urine AMBER (*) BIOCHEMICALS MAY BE AFFECTED BY COLOR   APPearance CLOUDY (*)    Specific Gravity, Urine 1.044 (*)    Bilirubin Urine SMALL (*)    Ketones, ur 15 (*)    Protein, ur 30 (*)    Leukocytes, UA MODERATE (*)    All other components within normal limits  URINE MICROSCOPIC-ADD ON - Abnormal; Notable for the following:    Squamous Epithelial / LPF FEW (*)    Bacteria, UA MANY (*)    Crystals CA OXALATE CRYSTALS (*)    All other components within normal limits  DIFFERENTIAL  LIPASE, BLOOD   No results found.   1. Diarrhea   2. Urinary tract infection       MDM  Diarrhea with lightheadedness upon standing without focal neurological symptoms. Abdomen soft.  Symptoms improved after IVF. Tolerating PO.      Date: 02/01/2012  Rate: 73  Rhythm: normal sinus rhythm  QRS Axis: normal  Intervals: normal  ST/T Wave abnormalities: normal  Conduction Disutrbances:none  Narrative Interpretation:   Old EKG Reviewed: none available    Glynn Octave, MD 02/02/12 0131

## 2012-02-02 MED ORDER — CIPROFLOXACIN HCL 500 MG PO TABS: 500.0000 mg | ORAL_TABLET | Freq: Two times a day (BID) | ORAL | Status: AC

## 2012-03-28 ENCOUNTER — Emergency Department (HOSPITAL_BASED_OUTPATIENT_CLINIC_OR_DEPARTMENT_OTHER)
Admission: EM | Admit: 2012-03-28 | Discharge: 2012-03-28 | Disposition: A | Discharge status: Home / Self Care | Payer: BC Managed Care – PPO | Attending: Emergency Medicine | Admitting: Emergency Medicine

## 2012-03-28 ENCOUNTER — Encounter (HOSPITAL_BASED_OUTPATIENT_CLINIC_OR_DEPARTMENT_OTHER): Payer: Self-pay | Admitting: *Deleted

## 2012-03-28 DIAGNOSIS — M542 Cervicalgia: Secondary | ICD-10-CM | POA: Insufficient documentation

## 2012-03-28 DIAGNOSIS — F17200 Nicotine dependence, unspecified, uncomplicated: Secondary | ICD-10-CM | POA: Insufficient documentation

## 2012-03-28 MED ORDER — HYDROMORPHONE HCL PF 2 MG/ML IJ SOLN
2.0000 mg | Freq: Once | INTRAMUSCULAR | Status: DC
  Filled 2012-03-28: qty 1

## 2012-03-28 MED ORDER — HYDROCODONE-ACETAMINOPHEN 5-325 MG PO TABS: 2.0000 | ORAL_TABLET | ORAL | Status: AC | PRN

## 2012-03-28 MED ORDER — ONDANSETRON HCL 4 MG/2ML IJ SOLN
4.0000 mg | Freq: Once | INTRAMUSCULAR | Status: AC
  Administered 2012-03-28: 4 mg via INTRAMUSCULAR

## 2012-03-28 MED ORDER — PREDNISONE 10 MG PO TABS: ORAL_TABLET | ORAL | Status: DC

## 2012-03-28 MED ORDER — HYDROMORPHONE HCL PF 2 MG/ML IJ SOLN
2.0000 mg | Freq: Once | INTRAMUSCULAR | Status: AC
  Administered 2012-03-28: 2 mg via INTRAMUSCULAR

## 2012-03-28 MED ORDER — ONDANSETRON HCL 4 MG/2ML IJ SOLN
4.0000 mg | Freq: Once | INTRAMUSCULAR | Status: DC
  Filled 2012-03-28: qty 2

## 2012-03-28 NOTE — ED Provider Notes (Signed)
Medical screening examination/treatment/procedure(s) were performed by non-physician practitioner and as supervising physician I was immediately available for consultation/collaboration.   Forbes Cellar, MD 03/28/12 1820

## 2012-03-28 NOTE — ED Provider Notes (Signed)
History     CSN: [id number]  Arrival date & time 03/28/12  1712   First MD Initiated Contact with Patient 03/28/12 1738      Chief Complaint  Patient presents with  . Neck Pain    (Consider location/radiation/quality/duration/timing/severity/associated sxs/prior treatment) Patient is a 51 y.o. female presenting with neck pain. The history is provided by the patient. No language interpreter was used.  Neck Pain  This is a new problem. The current episode started 6 to 12 hours ago. The problem occurs constantly. The problem has been gradually worsening. The pain is associated with nothing. The maximum temperature recorded prior to her arrival was 100 to 100.9 F. The fever has been present for less than 1 day. The pain is present in the generalized neck. The quality of the pain is described as stabbing. The pain is at a severity of 6/10. The pain is moderate. The pain is the same all the time. Stiffness is present all day. She has tried nothing for the symptoms. The treatment provided no relief.    Past Medical History  Diagnosis Date  . Neck pain, chronic   . Chronic headaches   . Nerve damage     Past Surgical History  Procedure Date  . Neck surgery   . Tonsillectomy     No family history on file.  History  Substance Use Topics  . Smoking status: Current Everyday Smoker -- 1.0 packs/day  . Smokeless tobacco: Not on file  . Alcohol Use: No    OB History    Grav Para Term Preterm Abortions TAB SAB Ect Mult Living                  Review of Systems  HENT: Positive for neck pain.   All other systems reviewed and are negative.    Allergies  Anaprox  Home Medications   Current Outpatient Rx  Name Route Sig Dispense Refill  . DULOXETINE HCL 60 MG PO CPEP Oral Take 60 mg by mouth daily.      . IBUPROFEN 200 MG PO TABS Oral Take 400 mg by mouth every 6 (six) hours as needed. For pain     . LIDOCAINE 5 % EX PTCH Transdermal Place 1 patch onto the skin daily. Remove  & Discard patch within 12 hours or as directed by MD     . TIZANIDINE HCL 2 MG PO CAPS Oral Take 2 mg by mouth 3 (three) times daily.        BP 96/67  Pulse 130  Temp(Src) 97.5 F (36.4 C) (Oral)  Resp 20  SpO2 96%  Physical Exam  Nursing note and vitals reviewed. Constitutional: She is oriented to person, place, and time. She appears well-developed and well-nourished.  HENT:  Head: Normocephalic.  Right Ear: External ear normal.  Left Ear: External ear normal.  Eyes: Conjunctivae and EOM are normal. Pupils are equal, round, and reactive to light.  Neck: Normal range of motion. Neck supple.  Cardiovascular: Normal rate and normal heart sounds.   Pulmonary/Chest: Effort normal.  Abdominal: Soft.  Musculoskeletal: Normal range of motion.  Neurological: She is alert and oriented to person, place, and time. She has normal reflexes.  Skin: Skin is warm.  Psychiatric: She has a normal mood and affect.    ED Course  Procedures (including critical care time)  Labs Reviewed - No data to display No results found.   No diagnosis found.    MDM  Rx for prednisone x  5 days,  Hydrododone         Lonia Skinner [place], [place] 03/28/12 1812

## 2012-03-28 NOTE — Discharge Instructions (Signed)

## 2012-03-28 NOTE — ED Notes (Signed)
Neck pain x 1 week. No known injury. States her MD called in a Rx for Prednisone. She has one more day left but no relief of pain.

## 2012-04-10 ENCOUNTER — Encounter (HOSPITAL_BASED_OUTPATIENT_CLINIC_OR_DEPARTMENT_OTHER): Payer: Self-pay | Admitting: *Deleted

## 2012-04-10 ENCOUNTER — Emergency Department (HOSPITAL_BASED_OUTPATIENT_CLINIC_OR_DEPARTMENT_OTHER)
Admission: EM | Admit: 2012-04-10 | Discharge: 2012-04-10 | Disposition: A | Discharge status: Home / Self Care | Payer: BC Managed Care – PPO | Attending: Emergency Medicine | Admitting: Emergency Medicine

## 2012-04-10 DIAGNOSIS — B009 Herpesviral infection, unspecified: Secondary | ICD-10-CM | POA: Insufficient documentation

## 2012-04-10 DIAGNOSIS — Z79899 Other long term (current) drug therapy: Secondary | ICD-10-CM | POA: Insufficient documentation

## 2012-04-10 DIAGNOSIS — F17200 Nicotine dependence, unspecified, uncomplicated: Secondary | ICD-10-CM | POA: Insufficient documentation

## 2012-04-10 DIAGNOSIS — L0100 Impetigo, unspecified: Secondary | ICD-10-CM | POA: Insufficient documentation

## 2012-04-10 MED ORDER — MUPIROCIN CALCIUM 2 % EX CREA: TOPICAL_CREAM | Freq: Three times a day (TID) | CUTANEOUS | Status: AC

## 2012-04-10 MED ORDER — ACYCLOVIR 400 MG PO TABS: 400.0000 mg | ORAL_TABLET | Freq: Four times a day (QID) | ORAL | Status: AC

## 2012-04-10 MED ORDER — CEPHALEXIN 500 MG PO CAPS: 500.0000 mg | ORAL_CAPSULE | Freq: Four times a day (QID) | ORAL | Status: AC

## 2012-04-10 NOTE — ED Notes (Signed)
Blisters around her mouth x 2 days. Keeps getting worse with time.

## 2012-04-10 NOTE — ED Provider Notes (Signed)
History     CSN: [id number]  Arrival date & time 04/10/12  1839   First MD Initiated Contact with Patient 04/10/12 1920      No chief complaint on file.   (Consider location/radiation/quality/duration/timing/severity/associated sxs/prior treatment) Patient is a 51 y.o. female presenting with rash. The history is provided by the patient.  Rash  This is a new problem. The current episode started yesterday. The problem has been gradually worsening. The problem is associated with nothing. There has been no fever. The rash is present on the lips. The pain is at a severity of 5/10. The pain is mild. The pain has been constant since onset. Associated symptoms include blisters, pain and weeping. She has tried nothing for the symptoms.  Pt states pain and rash around mouth for several days but really worsened yesterday. Blisters, but now with redness and drainage. Swollen lymph nodes in neck. No other complaints. No fever, chills, no rash inside mouth or any other locations. No new products.   Past Medical History  Diagnosis Date  . Neck pain, chronic   . Chronic headaches   . Nerve damage     Past Surgical History  Procedure Date  . Neck surgery   . Tonsillectomy     No family history on file.  History  Substance Use Topics  . Smoking status: Current Everyday Smoker -- 1.0 packs/day  . Smokeless tobacco: Not on file  . Alcohol Use: No    OB History    Grav Para Term Preterm Abortions TAB SAB Ect Mult Living                  Review of Systems  Constitutional: Negative for fever.  HENT: Positive for facial swelling. Negative for sore throat, mouth sores, neck stiffness and dental problem.   Respiratory: Negative.   Cardiovascular: Negative.   Gastrointestinal: Negative.   Genitourinary: Negative.   Musculoskeletal: Negative.   Skin: Positive for rash.  Neurological: Negative.   Psychiatric/Behavioral: Negative.     Allergies  Anaprox  Home Medications   Current  Outpatient Rx  Name Route Sig Dispense Refill  . DULOXETINE HCL 60 MG PO CPEP Oral Take 60 mg by mouth daily.      . IBUPROFEN 200 MG PO TABS Oral Take 400 mg by mouth every 6 (six) hours as needed. For pain     . LIDOCAINE 5 % EX PTCH Transdermal Place 1 patch onto the skin daily. Remove & Discard patch within 12 hours or as directed by MD     . PREDNISONE 10 MG PO TABS  6,5,4,3,2,1 21 tablet 0  . TIZANIDINE HCL 2 MG PO CAPS Oral Take 2 mg by mouth 3 (three) times daily.        BP 111/61  Pulse 79  Temp(Src) 98.3 F (36.8 C) (Oral)  Resp 20  SpO2 98%  Physical Exam  Nursing note and vitals reviewed. Constitutional: She is oriented to person, place, and time. She appears well-developed and well-nourished. No distress.  HENT:  Head: Normocephalic and atraumatic.       There are blisters in goups, as well as erythema surrounding blisters along with wheeping, honey crusted rash surrounding lips and mouth, extending down the chin. Tender to palpation.   Eyes: Conjunctivae are normal.  Neck: Erythema present.       Bilateral anterior cervical lymphadenopathy  Cardiovascular: Normal rate, regular rhythm and normal heart sounds.   Pulmonary/Chest: Effort normal and breath sounds normal. No respiratory distress.  Musculoskeletal: She exhibits no edema.  Lymphadenopathy:    She has cervical adenopathy.  Neurological: She is alert and oriented to person, place, and time.  Skin: Skin is warm and dry.       See HENT exam  Psychiatric: She has a normal mood and affect.    ED Course  Procedures (including critical care time)  I suspect pt's orignal ras was herpes simplex, however, now it does appears infected. Will place on acyclovir and antibiotics for supra imposed infection.  1. Impetigo   2. Herpes simplex       MDM          Lottie Mussel, PA 04/10/12 2248

## 2012-04-10 NOTE — Discharge Instructions (Signed)
Take acyclovir as prescribed for infection. Take keflex as well until all gone. Apply either neosporin OR bactroban as prescribed topically. Follow up as needed. Return if worsening.   Fever Blisters, Herpes Simplex Herpes simplex is a virus. This virus causes fever blisters or cold sores. Fever blisters are small sores on the lips, gums, or roof of the mouth. People often get infected with this herpes virus but do not have any symptoms. The blisters may break out when a person is:  Tired.   Under stress.   Suffering from another infection (such as a cold).   Exposed to sunlight.  The blisters usually heal within 1 week. The virus can be easily passed to other people and to other parts of the body, such as the eyes and sex organs. CAUSES  A virus, herpes simplex, is the cause of fever blisters. This virus can be passed (transmitted) from person to person and is therefore contagious. There are 2 types of herpes simplex virus. Type 1 usually causes oral herpes or fever blisters. Type 2 usually causes genital herpes. Both viruses do have the potential to cause oral and genital infections. However, the type 1 virus causes more than 90% of recurrent fever blister outbreaks.  Herpes simplex virus is highly contagious when fever blisters are present. Close contact, including kissing, can spread the virus. Children often become infected by contact with others who have fever blisters. A child can spread the virus by rubbing the cold sore and touching other children or when other children touch clothing, wipes, or toys contaminated by an infected child with the virus. In adults, about 10% of oral herpes infections are from oral-genital sex with a person who has active genital herpes (type 2).  Type 1 herpes infection is very common, eventually occurring in up to 8 out of 10 otherwise healthy people. Most people become infected before they are 53 years old. The virus usually infects the lips, throat, or mouth.  Initial infection in children can be extensive with many lesions throughout the mouth. In adults, the first infection may cause no symptoms. Some adults may develop many fluid-filled blisters inside and outside the mouth 3 to 5 days after they are initially infected but severe infection is uncommon. Fever, swollen neck glands, and general aches may occur but this is also uncommon. The blisters tend to come together and then collapse. When on the lip, a yellowish crust forms over the sores. Healing of the area without scarring typically occurs within 2 weeks. Once a person is infected, the herpes virus permanently remains alive in the body within a nerve near the cheekbone. It then stays inactive at this site, only to sometimes travel down the nerve to the skin. This causes a recurrence of fever blisters. Recurrent blisters usually break out at the outside edge of the lip or edge of the nostril. Recurrent fever blisters may occasionally occur on the chin, cheeks, or inside the mouth. Recurrent fever blister attacks are usually not as painful and not as numerous as the first infection. Recurrences are less frequent after age 34. Many people who have recurring fever blisters feel itching, tingling, or burning at the lip border. This can occur hours or a couple days before the blister appears.  Factors which weaken the body's immune system may trigger an outbreak or recurrence of herpes. These include some drugs (such as steroids), emotional stress, fever, illness, sleep deprivation, and other injuries. Sunlight may also trigger an outbreak. Many women have recurrences only  during their menstrual period.  TREATMENT There is no cure for fever blisters. There is no vaccine for herpes simplex virus.  Certain medicines can relieve some of the pain and discomfort of the sores or promote more rapid healing. These include ointments that numb the blisters and medicines that control bacterial infections (antibiotics). A  number of drugs active against herpes viruses (antivirals), either applied locally as a gel or cream, or taken in pill form, may promote healing by keeping the virus from multiplying and infecting more local tissue.   Keep fever blisters clean and dry. This helps to prevent bacterial invasion of the virally infected tissues.   Eat a soft, bland diet to avoid irritating the sores.   Be careful not to touch the sores and spread the virus to new sites, such as:   Other areas of the face.   Eyes.   Genitals.   Make sure you do not infect others. Avoid kissing people when a fever blister is present. Avoid touching the sores and then touching others.   Sunscreen on the lips can prevent recurrences if outbreaks are triggered by sunlight. The sunscreen should be put on before going outside and reapplied often while in the sun.   Avoid stress if this seems to cause outbreaks.  HOME CARE INSTRUCTIONS   Only take over-the-counter or prescription medicines for pain, discomfort, or fever as directed by your caregiver. Do not use aspirin.   Do not touch the blisters or pick the scabs. Wash your hands often. Do not touch your eyes without washing your hands first.   Avoid close contact with other people, especially kissing, until blisters heal.   Hot, cold, or salty foods may hurt your mouth. Use a straw to drink. Eating a well-balanced diet will help healing.  SEEK MEDICAL CARE IF:   Your eye feels irritated, painful, or you feel like you have something in your eye.   You develop a fever, feel achy, or see pus instead of clear fluid in the sores. These are signs of a bacterial infection.   You get blisters on your genitals.   You develop new, unexplained symptoms.  MAKE SURE YOU:   Understand these instructions.   Will watch your condition.   Will get help right away if you are not doing well or get worse.  Document Released: 12/03/2005 Document Revised: 11/22/2011 Document Reviewed:  04/08/2008 [facility] Patient Information 2012 [place], [place].  Impetigo Impetigo is an infection of the skin, most common in babies and children.  CAUSES  It is caused by staphylococcal or streptococcal germs (bacteria). Impetigo can start after any damage to the skin. The damage to the skin may be from things like:   Chickenpox.   Scrapes.   Scratches.   Insect bites (common when children scratch the bite).   Cuts.   Nail biting or chewing.  Impetigo is contagious. It can be spread from one person to another. Avoid close skin contact, or sharing towels or clothing. SYMPTOMS  Impetigo usually starts out as small blisters or pustules. Then they turn into tiny yellow-crusted sores (lesions).  There may also be:  Large blisters.   Itching or pain.   Pus.   Swollen lymph glands.  With scratching, irritation, or non-treatment, these small areas may get larger. Scratching can cause the germs to get under the fingernails; then scratching another part of the skin can cause the infection to be spread there. DIAGNOSIS  Diagnosis of impetigo is usually made by a  physical exam. A skin culture (test to grow bacteria) may be done to prove the diagnosis or to help decide the best treatment.  TREATMENT  Mild impetigo can be treated with prescription antibiotic cream. Oral antibiotic medicine may be used in more severe cases. Medicines for itching may be used. HOME CARE INSTRUCTIONS   To avoid spreading impetigo to other body areas:   Keep fingernails short and clean.   Avoid scratching.   Cover infected areas if necessary to keep from scratching.   Gently wash the infected areas with antibiotic soap and water.   Soak crusted areas in warm soapy water using antibiotic soap.   Gently rub the areas to remove crusts. Do not scrub.   Wash hands often to avoid spread this infection.   Keep children with impetigo home from school or daycare until they have used an antibiotic cream for  48 hours (2 days) or oral antibiotic medicine for 24 hours (1 day), and their skin shows significant improvement.   Children may attend school or daycare if they only have a few sores and if the sores can be covered by a bandage or clothing.  SEEK MEDICAL CARE IF:   More blisters or sores show up despite treatment.   Other family members get sores.   Rash is not improving after 48 hours (2 days) of treatment.  SEEK IMMEDIATE MEDICAL CARE IF:   You see spreading redness or swelling of the skin around the sores.   You see red streaks coming from the sores.   Your child develops a fever of 100.4 F (37.2 C) or higher.   Your child develops a sore throat.   Your child is acting ill (lethargic, sick to their stomach).  Document Released: 11/30/2000 Document Revised: 11/22/2011 Document Reviewed: 09/29/2008 [facility] Patient Information 2012 [place], [place].

## 2012-04-13 NOTE — ED Provider Notes (Signed)
Medical screening examination/treatment/procedure(s) were performed by non-physician practitioner and as supervising physician I was immediately available for consultation/collaboration.  Geoffery Lyons, MD 04/13/12 [phone]

## 2012-07-22 ENCOUNTER — Emergency Department (HOSPITAL_BASED_OUTPATIENT_CLINIC_OR_DEPARTMENT_OTHER): Payer: BC Managed Care – PPO

## 2012-07-22 ENCOUNTER — Encounter (HOSPITAL_BASED_OUTPATIENT_CLINIC_OR_DEPARTMENT_OTHER): Payer: Self-pay

## 2012-07-22 ENCOUNTER — Emergency Department (HOSPITAL_BASED_OUTPATIENT_CLINIC_OR_DEPARTMENT_OTHER)
Admission: EM | Admit: 2012-07-22 | Discharge: 2012-07-22 | Disposition: A | Discharge status: Home / Self Care | Payer: BC Managed Care – PPO | Attending: Emergency Medicine | Admitting: Emergency Medicine

## 2012-07-22 DIAGNOSIS — F17200 Nicotine dependence, unspecified, uncomplicated: Secondary | ICD-10-CM | POA: Insufficient documentation

## 2012-07-22 DIAGNOSIS — G8929 Other chronic pain: Secondary | ICD-10-CM | POA: Insufficient documentation

## 2012-07-22 DIAGNOSIS — T85848A Pain due to other internal prosthetic devices, implants and grafts, initial encounter: Secondary | ICD-10-CM

## 2012-07-22 DIAGNOSIS — Z720 Tobacco use: Secondary | ICD-10-CM

## 2012-07-22 DIAGNOSIS — K029 Dental caries, unspecified: Secondary | ICD-10-CM | POA: Insufficient documentation

## 2012-07-22 HISTORY — DX: Bronchitis, not specified as acute or chronic: J40

## 2012-07-22 MED ORDER — HYDROCODONE-ACETAMINOPHEN 5-325 MG PO TABS
1.0000 | ORAL_TABLET | Freq: Once | ORAL | Status: AC
  Administered 2012-07-22: 1 via ORAL
  Filled 2012-07-22: qty 1

## 2012-07-22 MED ORDER — PENICILLIN V POTASSIUM 250 MG PO TABS
500.0000 mg | ORAL_TABLET | Freq: Once | ORAL | Status: AC
  Administered 2012-07-22: 500 mg via ORAL
  Filled 2012-07-22: qty 2

## 2012-07-22 MED ORDER — PENICILLIN V POTASSIUM 500 MG PO TABS: 500.0000 mg | ORAL_TABLET | Freq: Three times a day (TID) | ORAL | Status: AC

## 2012-07-22 MED ORDER — HYDROCODONE-ACETAMINOPHEN 5-325 MG PO TABS: 1.0000 | ORAL_TABLET | ORAL | Status: AC | PRN

## 2012-07-22 NOTE — ED Provider Notes (Signed)
History     CSN: [id number]  Arrival date & time 07/22/12  1346   First MD Initiated Contact with Patient 07/22/12 1419      Chief Complaint  Patient presents with  . Dental Pain  . Nasal Congestion    (Consider location/radiation/quality/duration/timing/severity/associated sxs/prior treatment) Patient is a 51 y.o. female presenting with tooth pain. The history is provided by the patient.  Dental PainThe primary symptoms include mouth pain. Primary symptoms do not include fever. The symptoms began yesterday.  Additional symptoms include: facial swelling. Additional symptoms do not include: trouble swallowing. Associated symptoms comments: Known dental fracture x 6 months ago, with pain and facial swelling at the site yesterday. No fever. .    Past Medical History  Diagnosis Date  . Neck pain, chronic   . Chronic headaches   . Nerve damage   . Asthma   . Bronchitis     History reviewed. No pertinent past surgical history.  History reviewed. No pertinent family history.  History  Substance Use Topics  . Smoking status: Current Everyday Smoker -- 1.0 packs/day  . Smokeless tobacco: Former Neurosurgeon    Quit date: 05/22/2012  . Alcohol Use: No    OB History    Grav Para Term Preterm Abortions TAB SAB Ect Mult Living                  Review of Systems  Constitutional: Negative for fever.  HENT: Positive for facial swelling and dental problem. Negative for trouble swallowing and neck pain.   Gastrointestinal: Negative for nausea.  Skin: Negative for rash.    Allergies  Anaprox and Tramadol  Home Medications   Current Outpatient Rx  Name Route Sig Dispense Refill  . DULOXETINE HCL 60 MG PO CPEP Oral Take 60 mg by mouth daily.      . IBUPROFEN 200 MG PO TABS Oral Take 400 mg by mouth every 6 (six) hours as needed. For pain     . LIDOCAINE 5 % EX PTCH Transdermal Place 1 patch onto the skin daily. Remove & Discard patch within 12 hours or as directed by MD     .  PREDNISONE 10 MG PO TABS  6,5,4,3,2,1 21 tablet 0  . TIZANIDINE HCL 2 MG PO CAPS Oral Take 2 mg by mouth 3 (three) times daily.        BP 121/77  Pulse 84  Temp 98.1 F (36.7 C) (Oral)  Resp 14  Ht 5\' 4"  (1.626 m)  Wt 176 lb (79.833 kg)  BMI 30.21 kg/m2  SpO2 95%  Physical Exam  Constitutional: She is oriented to person, place, and time. She appears well-developed and well-nourished.  HENT:       Poor dentition generally with widespread decay, partially edentulous. #20 eroded to gum line with adjacent swelling. No pointing abscess. No active drainage. Submental area soft without mass.   Neck: Normal range of motion.  Pulmonary/Chest: Effort normal.  Lymphadenopathy:    She has no cervical adenopathy.  Neurological: She is alert and oriented to person, place, and time.  Skin: Skin is warm and dry.    ED Course  Procedures (including critical care time)  Labs Reviewed - No data to display No results found.   No diagnosis found.  1. Dental pain 2. Tobacco abuse   MDM  Will treat for dental infection with abx and pain medications.        Rodena Medin, PA-C 07/22/12 1438

## 2012-07-22 NOTE — ED Provider Notes (Signed)
Medical screening examination/treatment/procedure(s) were performed by non-physician practitioner and as supervising physician I was immediately available for consultation/collaboration.   Rolan Bucco, MD 07/22/12 1501

## 2012-07-22 NOTE — ED Notes (Signed)
C/o of pain and swelling to left side of face x 2 days-- hx of "bad tooth" x 6 months

## 2012-07-22 NOTE — ED Notes (Signed)
Pt c/o left toothache x 2 days

## 2012-09-18 ENCOUNTER — Encounter (HOSPITAL_BASED_OUTPATIENT_CLINIC_OR_DEPARTMENT_OTHER): Payer: Self-pay | Admitting: *Deleted

## 2012-09-18 ENCOUNTER — Emergency Department (HOSPITAL_BASED_OUTPATIENT_CLINIC_OR_DEPARTMENT_OTHER)
Admission: EM | Admit: 2012-09-18 | Discharge: 2012-09-18 | Disposition: A | Discharge status: Home / Self Care | Payer: BC Managed Care – PPO | Attending: Emergency Medicine | Admitting: Emergency Medicine

## 2012-09-18 DIAGNOSIS — J45909 Unspecified asthma, uncomplicated: Secondary | ICD-10-CM | POA: Insufficient documentation

## 2012-09-18 DIAGNOSIS — Z888 Allergy status to other drugs, medicaments and biological substances status: Secondary | ICD-10-CM | POA: Insufficient documentation

## 2012-09-18 DIAGNOSIS — F17200 Nicotine dependence, unspecified, uncomplicated: Secondary | ICD-10-CM | POA: Insufficient documentation

## 2012-09-18 DIAGNOSIS — M545 Low back pain, unspecified: Secondary | ICD-10-CM | POA: Insufficient documentation

## 2012-09-18 DIAGNOSIS — G8929 Other chronic pain: Secondary | ICD-10-CM | POA: Insufficient documentation

## 2012-09-18 DIAGNOSIS — M542 Cervicalgia: Secondary | ICD-10-CM | POA: Insufficient documentation

## 2012-09-18 MED ORDER — HYDROCODONE-ACETAMINOPHEN 5-500 MG PO TABS: 1.0000 | ORAL_TABLET | Freq: Four times a day (QID) | ORAL | Status: DC | PRN

## 2012-09-18 NOTE — ED Provider Notes (Signed)
History     CSN: [id number]  Arrival date & time 09/18/12  1714   First MD Initiated Contact with Patient 09/18/12 1801      Chief Complaint  Patient presents with  . Back Pain    (Consider location/radiation/quality/duration/timing/severity/associated sxs/prior treatment) HPI Comments: Patient with a history of recurrent low back pain.  It flared up again about one week ago.  There is no radiation in to the legs.  There are no bowel or bladder complaints.    Patient is a 51 y.o. female presenting with back pain. The history is provided by the patient.  Back Pain  This is a recurrent problem. Episode onset: on week ago. The problem occurs constantly. The problem has not changed since onset.The pain is associated with no known injury. The quality of the pain is described as stabbing. The pain does not radiate. The pain is moderate. The symptoms are aggravated by bending, twisting and certain positions. The pain is the same all the time. Pertinent negatives include no bowel incontinence, no bladder incontinence, no dysuria, no paresis, no tingling and no weakness. She has tried NSAIDs for the symptoms. The treatment provided no relief.    Past Medical History  Diagnosis Date  . Neck pain, chronic   . Chronic headaches   . Nerve damage   . Asthma   . Bronchitis     History reviewed. No pertinent past surgical history.  History reviewed. No pertinent family history.  History  Substance Use Topics  . Smoking status: Current Every Day Smoker -- 1.0 packs/day  . Smokeless tobacco: Former Neurosurgeon    Quit date: 05/22/2012  . Alcohol Use: No    OB History    Grav Para Term Preterm Abortions TAB SAB Ect Mult Living                  Review of Systems  Gastrointestinal: Negative for bowel incontinence.  Genitourinary: Negative for bladder incontinence and dysuria.  Musculoskeletal: Positive for back pain.  Neurological: Negative for tingling and weakness.  All other systems  reviewed and are negative.    Allergies  Anaprox and Tramadol  Home Medications   Current Outpatient Rx  Name Route Sig Dispense Refill  . DULOXETINE HCL 60 MG PO CPEP Oral Take 60 mg by mouth daily.      . IBUPROFEN 200 MG PO TABS Oral Take 400 mg by mouth every 6 (six) hours as needed. For pain     . LIDOCAINE 5 % EX PTCH Transdermal Place 1 patch onto the skin daily. Remove & Discard patch within 12 hours or as directed by MD     . PREDNISONE 10 MG PO TABS  6,5,4,3,2,1 21 tablet 0  . TIZANIDINE HCL 2 MG PO CAPS Oral Take 2 mg by mouth 3 (three) times daily.        BP 109/77  Pulse 84  Temp 98.3 F (36.8 C) (Oral)  Resp 16  Ht 5\' 2"  (1.575 m)  Wt 115 lb (52.164 kg)  BMI 21.03 kg/m2  SpO2 97%  Physical Exam  Nursing note and vitals reviewed. Constitutional: She is oriented to person, place, and time. She appears well-developed and well-nourished. No distress.  HENT:  Head: Normocephalic and atraumatic.  Neck: Normal range of motion. Neck supple.  Abdominal: Soft. Bowel sounds are normal.  Musculoskeletal:       There is ttp in the soft tissues of the lumbar spine.  There is no bony ttp or stepoffs.  Neurological: She is alert and oriented to person, place, and time.       Strength is 5/5 in the ble.  DTR's are 2 + and equal in the ble.  Able to walk on heels and toes without difficulty.  Skin: She is not diaphoretic.    ED Course  Procedures (including critical care time)  Labs Reviewed - No data to display No results found.   No diagnosis found.    MDM  This seems like a musculoskeletal pain and there are no red flags to suggest an emergent cause.  She will be treated with nsaids and lortab.  Needs follow up with pcp if not improving in the next week.        Geoffery Lyons, MD 09/18/12 [phone]

## 2012-09-18 NOTE — ED Notes (Signed)
Pt c/o lower back pain x 2 days

## 2012-09-26 ENCOUNTER — Emergency Department (HOSPITAL_BASED_OUTPATIENT_CLINIC_OR_DEPARTMENT_OTHER)
Admission: EM | Admit: 2012-09-26 | Discharge: 2012-09-26 | Disposition: A | Discharge status: Home / Self Care | Payer: Federal, State, Local not specified - PPO | Attending: Emergency Medicine | Admitting: Emergency Medicine

## 2012-09-26 ENCOUNTER — Encounter (HOSPITAL_BASED_OUTPATIENT_CLINIC_OR_DEPARTMENT_OTHER): Payer: Self-pay | Admitting: *Deleted

## 2012-09-26 DIAGNOSIS — K0530 Chronic periodontitis, unspecified: Secondary | ICD-10-CM | POA: Insufficient documentation

## 2012-09-26 DIAGNOSIS — M542 Cervicalgia: Secondary | ICD-10-CM | POA: Insufficient documentation

## 2012-09-26 DIAGNOSIS — R51 Headache: Secondary | ICD-10-CM | POA: Insufficient documentation

## 2012-09-26 DIAGNOSIS — G8929 Other chronic pain: Secondary | ICD-10-CM | POA: Insufficient documentation

## 2012-09-26 DIAGNOSIS — F17200 Nicotine dependence, unspecified, uncomplicated: Secondary | ICD-10-CM | POA: Insufficient documentation

## 2012-09-26 DIAGNOSIS — J45909 Unspecified asthma, uncomplicated: Secondary | ICD-10-CM | POA: Insufficient documentation

## 2012-09-26 DIAGNOSIS — Z888 Allergy status to other drugs, medicaments and biological substances status: Secondary | ICD-10-CM | POA: Insufficient documentation

## 2012-09-26 MED ORDER — HYDROCODONE-ACETAMINOPHEN 5-500 MG PO TABS: 1.0000 | ORAL_TABLET | Freq: Four times a day (QID) | ORAL | Status: DC | PRN

## 2012-09-26 MED ORDER — PENICILLIN V POTASSIUM 500 MG PO TABS: 500.0000 mg | ORAL_TABLET | Freq: Four times a day (QID) | ORAL | Status: AC

## 2012-09-26 NOTE — ED Notes (Signed)
MD at bedside. 

## 2012-09-26 NOTE — ED Provider Notes (Signed)
History     CSN: [id number]  Arrival date & time 09/26/12  1631   First MD Initiated Contact with Patient 09/26/12 1641      Chief Complaint  Patient presents with  . Dental Pain    (Consider location/radiation/quality/duration/timing/severity/associated sxs/prior treatment) HPI Comments: Pt has had all of her teeth pulled about 2 months ago, today had pain, swelling, drainage from there right lower gum  Patient is a 51 y.o. female presenting with tooth pain. The history is provided by the patient.  Dental PainThe primary symptoms include mouth pain. Primary symptoms do not include headaches, fever or sore throat. The symptoms began 6 to 12 hours ago. The symptoms are worsening. The symptoms are new. The symptoms occur constantly.  Additional symptoms include: gum swelling and gum tenderness. Additional symptoms do not include: trismus, facial swelling, trouble swallowing and drooling.    Past Medical History  Diagnosis Date  . Neck pain, chronic   . Chronic headaches   . Nerve damage   . Asthma   . Bronchitis     History reviewed. No pertinent past surgical history.  No family history on file.  History  Substance Use Topics  . Smoking status: Current Every Day Smoker -- 1.0 packs/day  . Smokeless tobacco: Former Neurosurgeon    Quit date: 05/22/2012  . Alcohol Use: No    OB History    Grav Para Term Preterm Abortions TAB SAB Ect Mult Living                  Review of Systems  Constitutional: Negative for fever.  HENT: Positive for dental problem. Negative for sore throat, facial swelling, drooling, mouth sores and trouble swallowing.   Gastrointestinal: Negative for nausea and vomiting.  Neurological: Negative for headaches.    Allergies  Anaprox and Tramadol  Home Medications   Current Outpatient Rx  Name Route Sig Dispense Refill  . DULOXETINE HCL 60 MG PO CPEP Oral Take 60 mg by mouth daily.      Marland Kitchen HYDROCODONE-ACETAMINOPHEN 5-500 MG PO TABS Oral Take 1-2  tablets by mouth every 6 (six) hours as needed for pain. 15 tablet 0  . HYDROCODONE-ACETAMINOPHEN 5-500 MG PO TABS Oral Take 1-2 tablets by mouth every 6 (six) hours as needed for pain. 15 tablet 0  . IBUPROFEN 200 MG PO TABS Oral Take 400 mg by mouth every 6 (six) hours as needed. For pain     . LIDOCAINE 5 % EX PTCH Transdermal Place 1 patch onto the skin daily. Remove & Discard patch within 12 hours or as directed by MD     . PENICILLIN V POTASSIUM 500 MG PO TABS Oral Take 1 tablet (500 mg total) by mouth 4 (four) times daily. 40 tablet 0  . PREDNISONE 10 MG PO TABS  6,5,4,3,2,1 21 tablet 0  . TIZANIDINE HCL 2 MG PO CAPS Oral Take 2 mg by mouth 3 (three) times daily.        BP 105/75  Pulse 102  Temp 97.8 F (36.6 C) (Oral)  Resp 20  SpO2 98%  Physical Exam  Constitutional: She is oriented to person, place, and time.  HENT:  Head: Normocephalic.  Mouth/Throat: Oropharynx is clear and moist.       Mild redness and swelling to right lower gum.  Pt without any teeth  Neck:       No trismus  Cardiovascular: Normal rate.   Pulmonary/Chest: Effort normal.  Lymphadenopathy:    She has no  cervical adenopathy.  Neurological: She is alert and oriented to person, place, and time.  Skin: Skin is warm and dry.    ED Course  Procedures (including critical care time)  Labs Reviewed - No data to display No results found.   1. Periodontitis       MDM  Pt started on abx, pain meds.  Encouraged to f/u with her dentist next week which she says that she can do        Rolan Bucco, MD 09/26/12 1706

## 2012-09-26 NOTE — ED Notes (Signed)
Dental pain. States she had all her teeth pulled 3 months ago. She woke with pain in her gums and she feels like her implant is coming out.

## 2012-11-03 ENCOUNTER — Encounter (HOSPITAL_BASED_OUTPATIENT_CLINIC_OR_DEPARTMENT_OTHER): Payer: Self-pay | Admitting: *Deleted

## 2012-11-03 ENCOUNTER — Emergency Department (HOSPITAL_BASED_OUTPATIENT_CLINIC_OR_DEPARTMENT_OTHER)
Admission: EM | Admit: 2012-11-03 | Discharge: 2012-11-03 | Disposition: A | Discharge status: Home / Self Care | Payer: Federal, State, Local not specified - PPO | Attending: Emergency Medicine | Admitting: Emergency Medicine

## 2012-11-03 DIAGNOSIS — F17200 Nicotine dependence, unspecified, uncomplicated: Secondary | ICD-10-CM | POA: Insufficient documentation

## 2012-11-03 DIAGNOSIS — M545 Low back pain, unspecified: Secondary | ICD-10-CM | POA: Insufficient documentation

## 2012-11-03 DIAGNOSIS — G8929 Other chronic pain: Secondary | ICD-10-CM | POA: Insufficient documentation

## 2012-11-03 DIAGNOSIS — J45909 Unspecified asthma, uncomplicated: Secondary | ICD-10-CM | POA: Insufficient documentation

## 2012-11-03 DIAGNOSIS — Z79899 Other long term (current) drug therapy: Secondary | ICD-10-CM | POA: Insufficient documentation

## 2012-11-03 MED ORDER — OXYCODONE-ACETAMINOPHEN 5-325 MG PO TABS
2.0000 | ORAL_TABLET | Freq: Once | ORAL | Status: AC
  Administered 2012-11-03: 2 via ORAL
  Filled 2012-11-03 (×2): qty 2

## 2012-11-03 NOTE — ED Notes (Signed)
Pt c/o increased back pain x 3 days hx chronic back pain

## 2012-11-03 NOTE — ED Provider Notes (Signed)
History  This chart was scribed for Shyia Fillingim B. Bernette Mayers, MD by Ardeen Jourdain, ED Scribe. This patient was seen in room MH01/MH01 and the patient's care was started at 1546.  CSN: [id number]  Arrival date & time 11/03/12  1424   First MD Initiated Contact with Patient 11/03/12 1546      Chief Complaint  Patient presents with  . Back Pain     The history is provided by the patient. No language interpreter was used.   Angela Pineda is a 51 y.o. female who presents to the Emergency Department complaining of chronic low back pain that has been gradually worsening over the past 3 days. She denies any injury or fall to cause the pain. She also denies urinary incontinence or bowel incontinence. She describes the pain as "bones rubbing together," and states it radiates down to her legs. She reports seeing a pain specialist for the pain but she had to stop due to financial issues. She has a h/o chronic neck pain, chronic HA, nerve damage, asthma and bronchitis. Pt is a current everyday smoker but denies alcohol use.    Past Medical History  Diagnosis Date  . Neck pain, chronic   . Chronic headaches   . Nerve damage   . Asthma   . Bronchitis     History reviewed. No pertinent past surgical history.  History reviewed. No pertinent family history.  History  Substance Use Topics  . Smoking status: Current Every Day Smoker -- 1.0 packs/day  . Smokeless tobacco: Former Neurosurgeon    Quit date: 05/22/2012  . Alcohol Use: No   No OB history available.   Review of Systems  All other systems reviewed and are negative.  A complete 10 system review of systems was obtained and all systems are negative except as noted in the HPI and PMH.    Allergies  Anaprox and Tramadol  Home Medications   Current Outpatient Rx  Name  Route  Sig  Dispense  Refill  . DULOXETINE HCL 60 MG PO CPEP   Oral   Take 60 mg by mouth daily.           Marland Kitchen HYDROCODONE-ACETAMINOPHEN 5-500 MG PO TABS   Oral  Take 1-2 tablets by mouth every 6 (six) hours as needed for pain.   15 tablet   0   . HYDROCODONE-ACETAMINOPHEN 5-500 MG PO TABS   Oral   Take 1-2 tablets by mouth every 6 (six) hours as needed for pain.   15 tablet   0   . IBUPROFEN 200 MG PO TABS   Oral   Take 400 mg by mouth every 6 (six) hours as needed. For pain          . LIDOCAINE 5 % EX PTCH   Transdermal   Place 1 patch onto the skin daily. Remove & Discard patch within 12 hours or as directed by MD          . PREDNISONE 10 MG PO TABS      6,5,4,3,2,1   21 tablet   0   . TIZANIDINE HCL 2 MG PO CAPS   Oral   Take 2 mg by mouth 3 (three) times daily.             Triage Vitals: BP 98/35  Pulse 79  Temp 98.2 F (36.8 C) (Oral)  Resp 16  Ht 5\' 2"  (1.575 m)  Wt 115 lb (52.164 kg)  BMI 21.03 kg/m2  SpO2 96%  Physical Exam  Nursing note and vitals reviewed. Constitutional: She is oriented to person, place, and time. She appears well-developed and well-nourished.  HENT:  Head: Normocephalic and atraumatic.  Eyes: EOM are normal. Pupils are equal, round, and reactive to light.  Neck: Normal range of motion. Neck supple.  Cardiovascular: Normal rate, normal heart sounds and intact distal pulses.   Pulmonary/Chest: Effort normal and breath sounds normal.  Abdominal: Bowel sounds are normal. She exhibits no distension. There is no tenderness.  Musculoskeletal: Normal range of motion. She exhibits tenderness. She exhibits no edema.       Paraspinal tenderness from thoracic to lumbar region   Neurological: She is alert and oriented to person, place, and time. She has normal strength. No cranial nerve deficit or sensory deficit.  Skin: Skin is warm and dry. No rash noted.  Psychiatric: She has a normal mood and affect.    ED Course  Procedures (including critical care time)  DIAGNOSTIC STUDIES: Oxygen Saturation is 96% on room air, adequate by my interpretation.    COORDINATION OF CARE:  3:50 PM:  Discussed treatment plan which includes a follow up with a pain specialist with pt at bedside and pt agreed to plan.    Labs Reviewed - No data to display No results found.   No diagnosis found.    MDM  Chronic back pain, multiple ED visits for similar, previously getting Oxycontin from Neurologist in [place].Advised PCP followup for long term pain control. No ED Rx.      I personally performed the services described in this documentation, which was scribed in my presence. The recorded information has been reviewed and is accurate.     Doreen Garretson B. Bernette Mayers, MD 11/03/12 2150

## 2013-02-17 ENCOUNTER — Emergency Department (HOSPITAL_BASED_OUTPATIENT_CLINIC_OR_DEPARTMENT_OTHER)
Admission: EM | Admit: 2013-02-17 | Discharge: 2013-02-18 | Disposition: A | Discharge status: Home / Self Care | Payer: Federal, State, Local not specified - PPO | Attending: Emergency Medicine | Admitting: Emergency Medicine

## 2013-02-17 ENCOUNTER — Encounter (HOSPITAL_BASED_OUTPATIENT_CLINIC_OR_DEPARTMENT_OTHER): Payer: Self-pay | Admitting: *Deleted

## 2013-02-17 DIAGNOSIS — G8918 Other acute postprocedural pain: Secondary | ICD-10-CM | POA: Insufficient documentation

## 2013-02-17 DIAGNOSIS — Z8739 Personal history of other diseases of the musculoskeletal system and connective tissue: Secondary | ICD-10-CM | POA: Insufficient documentation

## 2013-02-17 DIAGNOSIS — Z87828 Personal history of other (healed) physical injury and trauma: Secondary | ICD-10-CM | POA: Insufficient documentation

## 2013-02-17 DIAGNOSIS — Z79899 Other long term (current) drug therapy: Secondary | ICD-10-CM | POA: Insufficient documentation

## 2013-02-17 DIAGNOSIS — F17200 Nicotine dependence, unspecified, uncomplicated: Secondary | ICD-10-CM | POA: Insufficient documentation

## 2013-02-17 DIAGNOSIS — J45909 Unspecified asthma, uncomplicated: Secondary | ICD-10-CM | POA: Insufficient documentation

## 2013-02-17 DIAGNOSIS — M436 Torticollis: Secondary | ICD-10-CM | POA: Insufficient documentation

## 2013-02-17 DIAGNOSIS — G8929 Other chronic pain: Secondary | ICD-10-CM | POA: Insufficient documentation

## 2013-02-17 MED ORDER — OXYCODONE-ACETAMINOPHEN 5-325 MG PO TABS: 1.0000 | ORAL_TABLET | ORAL | Status: DC | PRN

## 2013-02-17 NOTE — ED Provider Notes (Signed)
History     CSN: [id number]  Arrival date & time 02/17/13  2239   First MD Initiated Contact with Patient 02/17/13 2328      Chief Complaint  Patient presents with  . Neck Pain    (Consider location/radiation/quality/duration/timing/severity/associated sxs/prior treatment) HPI This is a 52 year old female with a history of chronic neck pain status post work related injury. She has had neck surgery for this. She had been followed by a neurologist in [facility] until September of last year. She been on chronic soma and oxycodone until that time. She is here with a two-day history of acute exacerbation of her neck pain. She describes the pain as a "burning, drawing and aching". Her neck is held preferably to the left with decreased range of motion due to pain and spasm. The pain is moderate to severe, worse with attempted movement. She is also complaining of milder pain in her right upper extremity and fingertips in a pattern that does not fit a specific dermatome.  Past Medical History  Diagnosis Date  . Neck pain, chronic   . Chronic headaches   . Nerve damage   . Asthma   . Bronchitis     History reviewed. No pertinent past surgical history.  History reviewed. No pertinent family history.  History  Substance Use Topics  . Smoking status: Current Every Day Smoker -- 1.00 packs/day  . Smokeless tobacco: Former Neurosurgeon    Quit date: 05/22/2012  . Alcohol Use: No    OB History   Grav Para Term Preterm Abortions TAB SAB Ect Mult Living                  Review of Systems  All other systems reviewed and are negative.    Allergies  Anaprox and Tramadol  Home Medications   Current Outpatient Rx  Name  Route  Sig  Dispense  Refill  . FLUoxetine HCl (PROZAC PO)   Oral   Take 60 mg by mouth 1 day or 1 dose.         . ibuprofen (ADVIL,MOTRIN) 200 MG tablet   Oral   Take 400 mg by mouth every 6 (six) hours as needed. For pain          . DULoxetine (CYMBALTA) 60 MG  capsule   Oral   Take 60 mg by mouth daily.           Marland Kitchen HYDROcodone-acetaminophen (VICODIN) 5-500 MG per tablet   Oral   Take 1-2 tablets by mouth every 6 (six) hours as needed for pain.   15 tablet   0   . HYDROcodone-acetaminophen (VICODIN) 5-500 MG per tablet   Oral   Take 1-2 tablets by mouth every 6 (six) hours as needed for pain.   15 tablet   0   . lidocaine (LIDODERM) 5 %   Transdermal   Place 1 patch onto the skin daily. Remove & Discard patch within 12 hours or as directed by MD          . predniSONE (DELTASONE) 10 MG tablet      6,5,4,3,2,1   21 tablet   0   . tizanidine (ZANAFLEX) 2 MG capsule   Oral   Take 2 mg by mouth 3 (three) times daily.             BP 90/54  Pulse 74  Temp(Src) 97.6 F (36.4 C) (Oral)  Resp 18  Ht 5\' 2"  (1.575 m)  Wt 120 lb (  54.432 kg)  BMI 21.94 kg/m2  SpO2 100%  Physical Exam General: Well-developed, well-nourished female in no acute distress; appears older than age of record HENT: normocephalic, atraumatic Eyes: pupils equal round and reactive to light; extraocular muscles intact Neck: Decreased range of motion due to right-sided muscle spasm and pain on attempted range of motion Heart: regular rate and rhythm Lungs: clear to auscultation bilaterally Abdomen: soft; nondistended Extremities: No deformity; full range of motion Neurologic: Awake, alert and oriented; motor function intact in all extremities and symmetric; no facial droop Skin: Warm and dry Psychiatric: Flat affect    ED Course  Procedures (including critical care time)     MDM  Although the patient did not volunteer this information she is currently on Valium 10 mg 4 times a day as prescribed by her psychiatrist. Since thallium is a muscle relaxant we will not add any additional muscle relaxant. We will prescribe a small number of oxycodone tablets and refer her to occupational health.          Hanley Seamen, MD 02/17/13 [phone]

## 2013-02-17 NOTE — ED Notes (Addendum)
C/o neck pain since yesterday, no known injury

## 2013-03-13 ENCOUNTER — Encounter (HOSPITAL_COMMUNITY): Payer: Self-pay | Admitting: Emergency Medicine

## 2013-03-13 ENCOUNTER — Emergency Department (HOSPITAL_COMMUNITY)
Admission: EM | Admit: 2013-03-13 | Discharge: 2013-03-13 | Disposition: A | Discharge status: Home / Self Care | Payer: Federal, State, Local not specified - PPO | Attending: Emergency Medicine | Admitting: Emergency Medicine

## 2013-03-13 DIAGNOSIS — J45909 Unspecified asthma, uncomplicated: Secondary | ICD-10-CM | POA: Insufficient documentation

## 2013-03-13 DIAGNOSIS — G8929 Other chronic pain: Secondary | ICD-10-CM | POA: Insufficient documentation

## 2013-03-13 DIAGNOSIS — M542 Cervicalgia: Secondary | ICD-10-CM | POA: Insufficient documentation

## 2013-03-13 DIAGNOSIS — G43909 Migraine, unspecified, not intractable, without status migrainosus: Secondary | ICD-10-CM | POA: Insufficient documentation

## 2013-03-13 DIAGNOSIS — H53149 Visual discomfort, unspecified: Secondary | ICD-10-CM | POA: Insufficient documentation

## 2013-03-13 DIAGNOSIS — Z8669 Personal history of other diseases of the nervous system and sense organs: Secondary | ICD-10-CM | POA: Insufficient documentation

## 2013-03-13 DIAGNOSIS — Z79899 Other long term (current) drug therapy: Secondary | ICD-10-CM | POA: Insufficient documentation

## 2013-03-13 DIAGNOSIS — F17200 Nicotine dependence, unspecified, uncomplicated: Secondary | ICD-10-CM | POA: Insufficient documentation

## 2013-03-13 MED ORDER — KETOROLAC TROMETHAMINE 60 MG/2ML IM SOLN
60.0000 mg | Freq: Once | INTRAMUSCULAR | Status: AC
  Administered 2013-03-13: 60 mg via INTRAMUSCULAR
  Filled 2013-03-13: qty 2

## 2013-03-13 MED ORDER — METOCLOPRAMIDE HCL 10 MG PO TABS: 10.0000 mg | ORAL_TABLET | Freq: Four times a day (QID) | ORAL | Status: DC | PRN

## 2013-03-13 NOTE — ED Notes (Signed)
Pt c/o migraine x 24 hours, no relief with ibuprofen. Pt states she usually does not needs meds for HA. +photophobia, +nausea.

## 2013-03-13 NOTE — ED Provider Notes (Signed)
History     CSN: [id number]  Arrival date & time 03/13/13  2041   First MD Initiated Contact with Patient 03/13/13 2104      Chief Complaint  Patient presents with  . Migraine    (Consider location/radiation/quality/duration/timing/severity/associated sxs/prior treatment) HPI Comments: Patient comes to the ER for evaluation of migraine headache. Patient reports that she has a history of chronic and recurrent migraines. Patient reports a throbbing pain behind the left eye that is worsened by loud noises and bright lights. This is consistent with her previous headaches. She has no unusual features. Headache started yesterday, came on slowly and progressively worsened. No neck pain or stiffness. She has not had a fever. She has been using ibuprofen without improvement.  Patient is a 52 y.o. female presenting with migraines.  Migraine Associated symptoms include headaches.    Past Medical History  Diagnosis Date  . Neck pain, chronic   . Chronic headaches   . Nerve damage   . Asthma   . Bronchitis     Past Surgical History  Procedure Laterality Date  . Neck surgery      No family history on file.  History  Substance Use Topics  . Smoking status: Current Every Day Smoker -- 1.00 packs/day  . Smokeless tobacco: Former Neurosurgeon    Quit date: 05/22/2012  . Alcohol Use: No    OB History   Grav Para Term Preterm Abortions TAB SAB Ect Mult Living                  Review of Systems  Constitutional: Negative for fever.  HENT: Negative for neck pain and neck stiffness.   Eyes: Positive for photophobia.  Neurological: Positive for headaches.  All other systems reviewed and are negative.    Allergies  Anaprox and Tramadol  Home Medications   Current Outpatient Rx  Name  Route  Sig  Dispense  Refill  . ALPRAZolam (XANAX) 0.25 MG tablet   Oral   Take 0.25 mg by mouth 3 (three) times daily as needed for sleep. For anxiety         . FLUoxetine (PROZAC) 40 MG  capsule   Oral   Take 40 mg by mouth daily.         Marland Kitchen ibuprofen (ADVIL,MOTRIN) 200 MG tablet   Oral   Take 400 mg by mouth every 6 (six) hours as needed. For pain          . oxyCODONE-acetaminophen (PERCOCET/ROXICET) 5-325 MG per tablet   Oral   Take 1-2 tablets by mouth every 4 (four) hours as needed for pain.   20 tablet   0     BP 99/60  Pulse 65  Temp(Src) 98.2 F (36.8 C) (Oral)  Resp 16  Wt 120 lb (54.432 kg)  BMI 21.94 kg/m2  SpO2 96%  Physical Exam  Constitutional: She is oriented to person, place, and time. She appears well-developed and well-nourished. No distress.  HENT:  Head: Normocephalic and atraumatic.  Right Ear: Hearing normal.  Nose: Nose normal.  Mouth/Throat: Oropharynx is clear and moist and mucous membranes are normal.  Eyes: Conjunctivae and EOM are normal. Pupils are equal, round, and reactive to light.  Neck: Normal range of motion. Neck supple.  Cardiovascular: Normal rate, regular rhythm, S1 normal and S2 normal.  Exam reveals no gallop and no friction rub.   No murmur heard. Pulmonary/Chest: Effort normal and breath sounds normal. No respiratory distress. She exhibits no tenderness.  Abdominal:  Soft. Normal appearance and bowel sounds are normal. There is no hepatosplenomegaly. There is no tenderness. There is no rebound, no guarding, no tenderness at McBurney's point and negative Murphy's sign. No hernia.  Musculoskeletal: Normal range of motion.  Neurological: She is alert and oriented to person, place, and time. She has normal strength. No cranial nerve deficit or sensory deficit. Coordination normal. GCS eye subscore is 4. GCS verbal subscore is 5. GCS motor subscore is 6.  Skin: Skin is warm, dry and intact. No rash noted. No cyanosis.  Psychiatric: She has a normal mood and affect. Her speech is normal and behavior is normal. Thought content normal.    ED Course  Procedures (including critical care time)  Labs Reviewed - No data to  display No results found.   Diagnosis: Migraine headache    MDM  Patient comes to the ER for evaluation of migraine headache. Patient has a headache that started yesterday and has progressively worsened. She has a history of previous chronic headaches, migraine type. Headache today is similar to her previous headaches without any unusual features. Neurologic exam is entirely unremarkable. There is therefore no reason for any imaging or workup today. Patient does, however, driving and could not be given any sedating medications.        Gilda Crease, MD 03/13/13 2109

## 2013-03-23 ENCOUNTER — Encounter: Payer: Self-pay | Admitting: *Deleted

## 2013-03-23 ENCOUNTER — Emergency Department (INDEPENDENT_AMBULATORY_CARE_PROVIDER_SITE_OTHER)
Admission: EM | Admit: 2013-03-23 | Discharge: 2013-03-23 | Disposition: A | Discharge status: Home / Self Care | Payer: Federal, State, Local not specified - PPO | Source: Home / Self Care

## 2013-03-23 DIAGNOSIS — J209 Acute bronchitis, unspecified: Secondary | ICD-10-CM

## 2013-03-23 DIAGNOSIS — H9209 Otalgia, unspecified ear: Secondary | ICD-10-CM

## 2013-03-23 DIAGNOSIS — M26629 Arthralgia of temporomandibular joint, unspecified side: Secondary | ICD-10-CM

## 2013-03-23 HISTORY — DX: Panic disorder (episodic paroxysmal anxiety): F41.0

## 2013-03-23 MED ORDER — HYDROCODONE-ACETAMINOPHEN 5-325 MG PO TABS
ORAL_TABLET | ORAL | Status: DC
Start: 2013-03-23 — End: 2013-07-09

## 2013-03-23 MED ORDER — BENZONATATE 200 MG PO CAPS: 200.0000 mg | ORAL_CAPSULE | Freq: Every day | ORAL | Status: DC

## 2013-03-23 MED ORDER — PREDNISONE 20 MG PO TABS: 20.0000 mg | ORAL_TABLET | Freq: Two times a day (BID) | ORAL | Status: DC

## 2013-03-23 MED ORDER — DOXYCYCLINE HYCLATE 100 MG PO CAPS: 100.0000 mg | ORAL_CAPSULE | Freq: Two times a day (BID) | ORAL | Status: DC

## 2013-03-23 NOTE — ED Provider Notes (Signed)
History     CSN: [id number]  Arrival date & time 03/23/13  1718   None     Chief Complaint  Patient presents with  . Otalgia  . Headache  . Chills       HPI Comments: Patient complains of onset of cough and congestion about 6 days ago, followed by sore throat and sinus congestion.  The cough has become productive and worse at night.  She complains of tightness in her anterior chest.  She has had chills but no fever.  Last night she developed a left earache, and today both ears hurt.  She has now developed a typical headache. She continues to smoke. She has had pneumonia twice in the past, last episode in 2012  The history is provided by the patient.    Past Medical History  Diagnosis Date  . Neck pain, chronic   . Chronic headaches   . Nerve damage   . Asthma   . Bronchitis   . Panic attacks     Past Surgical History  Procedure Laterality Date  . Neck surgery      Family History  Problem Relation Age of Onset  . Hypertension Father     History  Substance Use Topics  . Smoking status: Current Every Day Smoker -- 1.00 packs/day  . Smokeless tobacco: Former Neurosurgeon    Quit date: 05/22/2012  . Alcohol Use: No    OB History   Grav Para Term Preterm Abortions TAB SAB Ect Mult Living                  Review of Systems + sore throat + cough No pleuritic pain, but has tightness in anterior chest No wheezing + nasal congestion + post-nasal drainage ? sinus pain/pressure No itchy/red eyes + earache No hemoptysis No SOB No fever, + chills No nausea No vomiting No abdominal pain No diarrhea No urinary symptoms No skin rashes + fatigue + myalgias + headache Used OTC meds without relief  Allergies  Anaprox and Tramadol  Home Medications   Current Outpatient Rx  Name  Route  Sig  Dispense  Refill  . ALPRAZolam (XANAX) 0.25 MG tablet   Oral   Take 0.25 mg by mouth 3 (three) times daily as needed for sleep. For anxiety         . benzonatate  (TESSALON) 200 MG capsule   Oral   Take 1 capsule (200 mg total) by mouth at bedtime.   12 capsule   0   . doxycycline (VIBRAMYCIN) 100 MG capsule   Oral   Take 1 capsule (100 mg total) by mouth 2 (two) times daily.   20 capsule   0   . FLUoxetine (PROZAC) 40 MG capsule   Oral   Take 40 mg by mouth daily.         Marland Kitchen HYDROcodone-acetaminophen (NORCO/VICODIN) 5-325 MG per tablet      Take one by mouth every 6 hours as needed for headache   10 tablet   0   . ibuprofen (ADVIL,MOTRIN) 200 MG tablet   Oral   Take 400 mg by mouth every 6 (six) hours as needed. For pain          . metoCLOPramide (REGLAN) 10 MG tablet   Oral   Take 1 tablet (10 mg total) by mouth every 6 (six) hours as needed (nausea/headache).   6 tablet   0   . oxyCODONE-acetaminophen (PERCOCET/ROXICET) 5-325 MG per tablet   Oral  Take 1-2 tablets by mouth every 4 (four) hours as needed for pain.   20 tablet   0   . predniSONE (DELTASONE) 20 MG tablet   Oral   Take 1 tablet (20 mg total) by mouth 2 (two) times daily. Take with food.   10 tablet   0     BP 95/67  Pulse 86  Temp(Src) 98 F (36.7 C) (Oral)  Resp 16  Ht 5\' 2"  (1.575 m)  Wt 126 lb (57.153 kg)  BMI 23.04 kg/m2  SpO2 94%  Physical Exam Nursing notes and Vital Signs reviewed. Appearance:  Patient appears healthy, older than stated age, and in no acute distress Eyes:  Pupils are equal, round, and reactive to light and accomodation.  Extraocular movement is intact.  Conjunctivae are not inflamed  Ears:  Canals normal.  Tympanic membranes normal.  There is distinct tenderness over both temporomandibular joints.  Palpation there recreates her pain.   Nose:  Mildly congested turbinates.  No sinus tenderness.    Pharynx:  Normal Neck:  Supple.  Tender shotty posterior nodes are palpated bilaterally  Lungs:   Bibasilar wheezes and rhonchi heard posteriorly.  Breath sounds are equal. Chest:  Distinct tenderness to palpation over the  mid-sternum.   Heart:  Regular rate and rhythm without murmurs, rubs, or gallops.  Abdomen:  Nontender without masses or hepatosplenomegaly.  Bowel sounds are present.  No CVA or flank tenderness.  Extremities:  No edema.  No calf tenderness Skin:  No rash present.   ED Course  Procedures  none  Labs Reviewed - Tympanogram normal right ear, positive peak pressure left ear (otherwise normal)     1. Acute bronchitis; secondary to viral URI   2. TMJ arthralgia;  No evidence otitis media      MDM  Begin prednisone burst.  Begin doxycycline.  Prescription written for Benzonatate [facility]) to take at bedtime for night-time cough.  Lortab (limited amount) for headache and TMJ pain. Take Mucinex D (guaifenesin with decongestant) twice daily for congestion.  Increase fluid intake, rest. May use Afrin nasal spray (or generic oxymetazoline) twice daily for about 5 days.  Also recommend using saline nasal spray several times daily and saline nasal irrigation (AYR is a common brand) Stop all antihistamines for now, and other non-prescription cough/cold preparations. May apply warm compress to TMJ's, or alternate ice with heat Follow-up with family doctor if not improving 7 to 10 days.         Lattie Haw, MD 03/23/13 [phone]

## 2013-03-23 NOTE — ED Notes (Signed)
Angela Pineda c/o bilateral ear pain, HA, body aches and chills x yesterday. Taken IBF otc without relief.

## 2013-03-25 ENCOUNTER — Telehealth: Payer: Self-pay | Admitting: Emergency Medicine

## 2013-04-27 ENCOUNTER — Encounter (HOSPITAL_COMMUNITY): Payer: Self-pay | Admitting: *Deleted

## 2013-04-27 ENCOUNTER — Emergency Department (HOSPITAL_COMMUNITY)
Admission: EM | Admit: 2013-04-27 | Discharge: 2013-04-27 | Disposition: A | Discharge status: Home / Self Care | Payer: Federal, State, Local not specified - PPO | Attending: Emergency Medicine | Admitting: Emergency Medicine

## 2013-04-27 DIAGNOSIS — G8929 Other chronic pain: Secondary | ICD-10-CM | POA: Insufficient documentation

## 2013-04-27 DIAGNOSIS — R599 Enlarged lymph nodes, unspecified: Secondary | ICD-10-CM | POA: Insufficient documentation

## 2013-04-27 DIAGNOSIS — IMO0002 Reserved for concepts with insufficient information to code with codable children: Secondary | ICD-10-CM | POA: Insufficient documentation

## 2013-04-27 DIAGNOSIS — F17200 Nicotine dependence, unspecified, uncomplicated: Secondary | ICD-10-CM | POA: Insufficient documentation

## 2013-04-27 DIAGNOSIS — H6092 Unspecified otitis externa, left ear: Secondary | ICD-10-CM

## 2013-04-27 DIAGNOSIS — J3489 Other specified disorders of nose and nasal sinuses: Secondary | ICD-10-CM | POA: Insufficient documentation

## 2013-04-27 DIAGNOSIS — H60399 Other infective otitis externa, unspecified ear: Secondary | ICD-10-CM | POA: Insufficient documentation

## 2013-04-27 DIAGNOSIS — Z79899 Other long term (current) drug therapy: Secondary | ICD-10-CM | POA: Insufficient documentation

## 2013-04-27 DIAGNOSIS — J45909 Unspecified asthma, uncomplicated: Secondary | ICD-10-CM | POA: Insufficient documentation

## 2013-04-27 DIAGNOSIS — Z8669 Personal history of other diseases of the nervous system and sense organs: Secondary | ICD-10-CM | POA: Insufficient documentation

## 2013-04-27 DIAGNOSIS — F410 Panic disorder [episodic paroxysmal anxiety] without agoraphobia: Secondary | ICD-10-CM | POA: Insufficient documentation

## 2013-04-27 MED ORDER — HYDROCODONE-ACETAMINOPHEN 5-325 MG PO TABS
1.0000 | ORAL_TABLET | Freq: Once | ORAL | Status: AC
  Administered 2013-04-27: 1 via ORAL
  Filled 2013-04-27: qty 1

## 2013-04-27 MED ORDER — HYDROCODONE-ACETAMINOPHEN 5-325 MG PO TABS
1.0000 | ORAL_TABLET | Freq: Four times a day (QID) | ORAL | Status: DC | PRN
Start: 2013-04-27 — End: 2013-07-27

## 2013-04-27 MED ORDER — NEOMYCIN-COLIST-HC-THONZONIUM 3.3-3-10-0.5 MG/ML OT SUSP
3.0000 [drp] | Freq: Once | OTIC | Status: AC
  Administered 2013-04-27: 3 [drp] via OTIC
  Filled 2013-04-27: qty 5

## 2013-04-27 NOTE — ED Provider Notes (Signed)
History     CSN: [id number]  Arrival date & time 04/27/13  0865   First MD Initiated Contact with Patient 04/27/13 0112      Chief Complaint  Patient presents with  . Otalgia    (Consider location/radiation/quality/duration/timing/severity/associated sxs/prior treatment) HPI Comments: Patient with left ear pain for the last 3, days, tender to palpation, or movement.  Taking over-the-counter medication without relief.  Does not have a history of ear infections.  Denies any ear discharge.  She states she has some slight congestion in the left nostril.  No history of seasonal allergies.  Denies any fever or headache  Patient is a 52 y.o. female presenting with ear pain. The history is provided by the patient.  Otalgia Location:  Left Behind ear:  No abnormality Quality:  Aching Severity:  Moderate Onset quality:  Gradual Duration:  3 days Timing:  Constant Progression:  Worsening Relieved by:  Nothing Worsened by:  Swallowing Ineffective treatments:  OTC medications Associated symptoms: congestion   Associated symptoms: no ear discharge, no fever, no headaches, no hearing loss, no neck pain, no rhinorrhea and no sore throat     Past Medical History  Diagnosis Date  . Neck pain, chronic   . Chronic headaches   . Nerve damage   . Asthma   . Bronchitis   . Panic attacks     Past Surgical History  Procedure Laterality Date  . Neck surgery      Family History  Problem Relation Age of Onset  . Hypertension Father     History  Substance Use Topics  . Smoking status: Current Every Day Smoker -- 1.00 packs/day  . Smokeless tobacco: Former Neurosurgeon    Quit date: 05/22/2012  . Alcohol Use: No    OB History   Grav Para Term Preterm Abortions TAB SAB Ect Mult Living                  Review of Systems  Constitutional: Negative for fever and chills.  HENT: Positive for ear pain and congestion. Negative for hearing loss, sore throat, rhinorrhea, trouble swallowing, neck  pain and ear discharge.   Neurological: Negative for dizziness and headaches.  All other systems reviewed and are negative.    Allergies  Anaprox and Tramadol  Home Medications   Current Outpatient Rx  Name  Route  Sig  Dispense  Refill  . ALPRAZolam (XANAX) 0.25 MG tablet   Oral   Take 0.25 mg by mouth 3 (three) times daily as needed for sleep. For anxiety         . benzonatate (TESSALON) 200 MG capsule   Oral   Take 1 capsule (200 mg total) by mouth at bedtime.   12 capsule   0   . doxycycline (VIBRAMYCIN) 100 MG capsule   Oral   Take 1 capsule (100 mg total) by mouth 2 (two) times daily.   20 capsule   0   . FLUoxetine (PROZAC) 40 MG capsule   Oral   Take 40 mg by mouth daily.         Marland Kitchen HYDROcodone-acetaminophen (NORCO/VICODIN) 5-325 MG per tablet      Take one by mouth every 6 hours as needed for headache   10 tablet   0   . HYDROcodone-acetaminophen (NORCO/VICODIN) 5-325 MG per tablet   Oral   Take 1 tablet by mouth every 6 (six) hours as needed for pain.   9 tablet   0   . ibuprofen (  ADVIL,MOTRIN) 200 MG tablet   Oral   Take 400 mg by mouth every 6 (six) hours as needed. For pain          . metoCLOPramide (REGLAN) 10 MG tablet   Oral   Take 1 tablet (10 mg total) by mouth every 6 (six) hours as needed (nausea/headache).   6 tablet   0   . oxyCODONE-acetaminophen (PERCOCET/ROXICET) 5-325 MG per tablet   Oral   Take 1-2 tablets by mouth every 4 (four) hours as needed for pain.   20 tablet   0   . predniSONE (DELTASONE) 20 MG tablet   Oral   Take 1 tablet (20 mg total) by mouth 2 (two) times daily. Take with food.   10 tablet   0     BP 105/63  Pulse 64  Temp(Src) 98.4 F (36.9 C) (Oral)  Resp 17  Ht 5\' 2"  (1.575 m)  Wt 125 lb (56.7 kg)  BMI 22.86 kg/m2  SpO2 97%  Physical Exam  Nursing note and vitals reviewed. Constitutional: She is oriented to person, place, and time. She appears well-developed and well-nourished.  HENT:   Head: Normocephalic and atraumatic.  Right Ear: External ear normal.  Left Ear: There is swelling and tenderness. No mastoid tenderness. No decreased hearing is noted.  Eyes: Pupils are equal, round, and reactive to light.  Neck: Normal range of motion.  Cardiovascular: Normal rate and regular rhythm.   Pulmonary/Chest: Effort normal and breath sounds normal.  Musculoskeletal: Normal range of motion.  Lymphadenopathy:    She has cervical adenopathy.  Neurological: She is alert and oriented to person, place, and time.  Skin: Skin is warm and dry.    ED Course  Procedures (including critical care time)  Labs Reviewed - No data to display No results found.   1. Otitis externa, left       MDM  Patient with otitis media, but is charged home with Cortisporin Otic drops provided the emergency department.  4 drops in the canal 3 times a day.  I will give her several Vicodin to use of the next 2 days, then she can switch to ibuprofen or Tylenol         Arman Filter, NP 04/27/13 0144

## 2013-04-27 NOTE — ED Provider Notes (Signed)
Medical screening examination/treatment/procedure(s) were performed by non-physician practitioner and as supervising physician I was immediately available for consultation/collaboration.   Dione Booze, MD 04/27/13 [phone]

## 2013-04-27 NOTE — ED Notes (Signed)
Pt c/o L ear pain unrelieved by OTC meds x 3 days.

## 2013-07-09 ENCOUNTER — Emergency Department (HOSPITAL_COMMUNITY)
Admission: EM | Admit: 2013-07-09 | Discharge: 2013-07-09 | Disposition: A | Discharge status: Home / Self Care | Payer: Federal, State, Local not specified - PPO | Attending: Emergency Medicine | Admitting: Emergency Medicine

## 2013-07-09 ENCOUNTER — Encounter (HOSPITAL_COMMUNITY): Payer: Self-pay | Admitting: Emergency Medicine

## 2013-07-09 DIAGNOSIS — M545 Low back pain, unspecified: Secondary | ICD-10-CM

## 2013-07-09 DIAGNOSIS — F17200 Nicotine dependence, unspecified, uncomplicated: Secondary | ICD-10-CM | POA: Insufficient documentation

## 2013-07-09 DIAGNOSIS — J45909 Unspecified asthma, uncomplicated: Secondary | ICD-10-CM | POA: Insufficient documentation

## 2013-07-09 DIAGNOSIS — G8911 Acute pain due to trauma: Secondary | ICD-10-CM | POA: Insufficient documentation

## 2013-07-09 DIAGNOSIS — IMO0002 Reserved for concepts with insufficient information to code with codable children: Secondary | ICD-10-CM | POA: Insufficient documentation

## 2013-07-09 DIAGNOSIS — G8929 Other chronic pain: Secondary | ICD-10-CM | POA: Insufficient documentation

## 2013-07-09 DIAGNOSIS — F410 Panic disorder [episodic paroxysmal anxiety] without agoraphobia: Secondary | ICD-10-CM | POA: Insufficient documentation

## 2013-07-09 DIAGNOSIS — Z79899 Other long term (current) drug therapy: Secondary | ICD-10-CM | POA: Insufficient documentation

## 2013-07-09 MED ORDER — IBUPROFEN 800 MG PO TABS: 800.0000 mg | ORAL_TABLET | Freq: Three times a day (TID) | ORAL | Status: DC

## 2013-07-09 MED ORDER — HYDROCODONE-ACETAMINOPHEN 5-325 MG PO TABS: ORAL_TABLET | ORAL | Status: DC

## 2013-07-09 MED ORDER — CYCLOBENZAPRINE HCL 10 MG PO TABS: 10.0000 mg | ORAL_TABLET | Freq: Two times a day (BID) | ORAL | Status: DC | PRN
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
## 2013-07-09 NOTE — ED Provider Notes (Signed)
Medical screening examination/treatment/procedure(s) were performed by non-physician practitioner and as supervising physician I was immediately available for consultation/collaboration.  Flint Melter, MD 07/09/13 [phone]

## 2013-07-23 ENCOUNTER — Emergency Department (HOSPITAL_BASED_OUTPATIENT_CLINIC_OR_DEPARTMENT_OTHER)
Admission: EM | Admit: 2013-07-23 | Discharge: 2013-07-23 | Disposition: A | Discharge status: Home / Self Care | Payer: Federal, State, Local not specified - PPO | Attending: Emergency Medicine | Admitting: Emergency Medicine

## 2013-07-23 ENCOUNTER — Encounter (HOSPITAL_BASED_OUTPATIENT_CLINIC_OR_DEPARTMENT_OTHER): Payer: Self-pay | Admitting: *Deleted

## 2013-07-23 DIAGNOSIS — Z79899 Other long term (current) drug therapy: Secondary | ICD-10-CM | POA: Insufficient documentation

## 2013-07-23 DIAGNOSIS — F17200 Nicotine dependence, unspecified, uncomplicated: Secondary | ICD-10-CM | POA: Insufficient documentation

## 2013-07-23 DIAGNOSIS — Z8669 Personal history of other diseases of the nervous system and sense organs: Secondary | ICD-10-CM | POA: Insufficient documentation

## 2013-07-23 DIAGNOSIS — G8929 Other chronic pain: Secondary | ICD-10-CM | POA: Insufficient documentation

## 2013-07-23 DIAGNOSIS — M5430 Sciatica, unspecified side: Secondary | ICD-10-CM | POA: Insufficient documentation

## 2013-07-23 DIAGNOSIS — IMO0002 Reserved for concepts with insufficient information to code with codable children: Secondary | ICD-10-CM | POA: Insufficient documentation

## 2013-07-23 DIAGNOSIS — F410 Panic disorder [episodic paroxysmal anxiety] without agoraphobia: Secondary | ICD-10-CM | POA: Insufficient documentation

## 2013-07-23 MED ORDER — METHOCARBAMOL 500 MG PO TABS: 500.0000 mg | ORAL_TABLET | Freq: Two times a day (BID) | ORAL | Status: DC

## 2013-07-23 MED ORDER — HYDROCODONE-ACETAMINOPHEN 5-325 MG PO TABS: 2.0000 | ORAL_TABLET | ORAL | Status: DC | PRN

## 2013-07-23 MED ORDER — HYDROCODONE-ACETAMINOPHEN 5-325 MG PO TABS
1.0000 | ORAL_TABLET | Freq: Once | ORAL | Status: AC
  Administered 2013-07-23: 1 via ORAL
  Filled 2013-07-23: qty 1

## 2013-07-23 MED ORDER — PREDNISONE 20 MG PO TABS: ORAL_TABLET | ORAL | Status: DC

## 2013-07-23 NOTE — ED Provider Notes (Signed)
CSN: [id number]     Arrival date & time 07/23/13  1039 History     First MD Initiated Contact with Patient 07/23/13 1106     Chief Complaint  Patient presents with  . Back Pain   (Consider location/radiation/quality/duration/timing/severity/associated sxs/prior Treatment) HPI Comments: Presents to the ER for evaluation of low back pain. Patient reports that the pain is in the lower back and radiates into her buttocks. She denies injury. No change of bowel or bladder function. No numbness or tingling in lower extremities, no weakness in lower extremities.  Patient is a 52 y.o. female presenting with back pain.  Back Pain   Past Medical History  Diagnosis Date  . Neck pain, chronic   . Chronic headaches   . Nerve damage   . Bronchitis   . Panic attacks    Past Surgical History  Procedure Laterality Date  . Neck surgery     Family History  Problem Relation Age of Onset  . Hypertension Father   . Cancer Mother    History  Substance Use Topics  . Smoking status: Current Every Day Smoker -- 1.00 packs/day    Types: Cigarettes  . Smokeless tobacco: Never Used  . Alcohol Use: No   OB History   Grav Para Term Preterm Abortions TAB SAB Ect Mult Living                 Review of Systems  Musculoskeletal: Positive for back pain.  Neurological: Negative.   All other systems reviewed and are negative.    Allergies  Anaprox and Tramadol  Home Medications   Current Outpatient Rx  Name  Route  Sig  Dispense  Refill  . ALPRAZolam (XANAX) 0.25 MG tablet   Oral   Take 0.25 mg by mouth 3 (three) times daily as needed for sleep. For anxiety         . benzonatate (TESSALON) 200 MG capsule   Oral   Take 1 capsule (200 mg total) by mouth at bedtime.   12 capsule   0   . cyclobenzaprine (FLEXERIL) 10 MG tablet   Oral   Take 1 tablet (10 mg total) by mouth 2 (two) times daily as needed for muscle spasms.   20 tablet   0   . doxycycline (VIBRAMYCIN) 100 MG capsule  Oral   Take 1 capsule (100 mg total) by mouth 2 (two) times daily.   20 capsule   0   . FLUoxetine (PROZAC) 40 MG capsule   Oral   Take 40 mg by mouth daily.         Marland Kitchen HYDROcodone-acetaminophen (NORCO/VICODIN) 5-325 MG per tablet   Oral   Take 1 tablet by mouth every 6 (six) hours as needed for pain.   9 tablet   0   . HYDROcodone-acetaminophen (NORCO/VICODIN) 5-325 MG per tablet      Take one by mouth every 6 hours as needed for headache   10 tablet   0   . ibuprofen (ADVIL,MOTRIN) 200 MG tablet   Oral   Take 400 mg by mouth every 6 (six) hours as needed. For pain          . ibuprofen (ADVIL,MOTRIN) 800 MG tablet   Oral   Take 1 tablet (800 mg total) by mouth 3 (three) times daily.   21 tablet   0   . metoCLOPramide (REGLAN) 10 MG tablet   Oral   Take 1 tablet (10 mg total) by mouth every 6 (  six) hours as needed (nausea/headache).   6 tablet   0   . oxyCODONE-acetaminophen (PERCOCET/ROXICET) 5-325 MG per tablet   Oral   Take 1-2 tablets by mouth every 4 (four) hours as needed for pain.   20 tablet   0   . predniSONE (DELTASONE) 20 MG tablet   Oral   Take 1 tablet (20 mg total) by mouth 2 (two) times daily. Take with food.   10 tablet   0    BP 109/69  Pulse 94  Temp(Src) 98.1 F (36.7 C) (Oral)  Resp 20  Ht 5\' 2"  (1.575 m)  Wt 120 lb (54.432 kg)  BMI 21.94 kg/m2  SpO2 96% Physical Exam  Constitutional: She is oriented to person, place, and time. She appears well-developed and well-nourished. No distress.  HENT:  Head: Normocephalic and atraumatic.  Right Ear: Hearing normal.  Left Ear: Hearing normal.  Nose: Nose normal.  Mouth/Throat: Oropharynx is clear and moist and mucous membranes are normal.  Eyes: Conjunctivae and EOM are normal. Pupils are equal, round, and reactive to light.  Neck: Normal range of motion. Neck supple.  Cardiovascular: Regular rhythm, S1 normal and S2 normal.  Exam reveals no gallop and no friction rub.   No murmur  heard. Pulmonary/Chest: Effort normal and breath sounds normal. No respiratory distress. She exhibits no tenderness.  Abdominal: Soft. Normal appearance and bowel sounds are normal. There is no hepatosplenomegaly. There is no tenderness. There is no rebound, no guarding, no tenderness at McBurney's point and negative Murphy's sign. No hernia.  Musculoskeletal: Normal range of motion.       Lumbar back: She exhibits tenderness.       Back:  Neurological: She is alert and oriented to person, place, and time. She has normal strength. No cranial nerve deficit or sensory deficit. Coordination normal. GCS eye subscore is 4. GCS verbal subscore is 5. GCS motor subscore is 6.  Reflex Scores:      Patellar reflexes are 2+ on the right side and 2+ on the left side. Skin: Skin is warm, dry and intact. No rash noted. No cyanosis.  Psychiatric: She has a normal mood and affect. Her speech is normal and behavior is normal. Thought content normal.    ED Course   Procedures (including critical care time)  Labs Reviewed - No data to display No results found.  Diagnosis: Sciatica  MDM  Patient presents to the ER with musculoskeletal back pain. Examination reveals back tenderness without any associated neurologic findings. Patient's strength, sensation and reflexes were normal. As such, patient did not require any imaging or further studies. Patient was treated with analgesia.  Gilda Crease, MD 07/23/13 1116

## 2013-07-23 NOTE — ED Notes (Signed)
In to d/c pt, pt requested pain med-EDP notified orders received
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
## 2013-07-24 ENCOUNTER — Encounter: Payer: Self-pay | Admitting: Family Medicine

## 2013-07-24 MED ORDER — HYDROCODONE-ACETAMINOPHEN 5-325 MG PO TABS: 1.0000 | ORAL_TABLET | Freq: Four times a day (QID) | ORAL | Status: DC | PRN

## 2013-07-25 ENCOUNTER — Other Ambulatory Visit: Payer: Self-pay | Admitting: Family Medicine

## 2013-07-27 ENCOUNTER — Ambulatory Visit (HOSPITAL_BASED_OUTPATIENT_CLINIC_OR_DEPARTMENT_OTHER)
Admission: RE | Admit: 2013-07-27 | Discharge: 2013-07-27 | Disposition: A | Discharge status: Home / Self Care | Payer: Federal, State, Local not specified - PPO | Source: Ambulatory Visit | Attending: Family Medicine | Admitting: Family Medicine

## 2013-07-27 ENCOUNTER — Ambulatory Visit (INDEPENDENT_AMBULATORY_CARE_PROVIDER_SITE_OTHER): Payer: Federal, State, Local not specified - PPO | Admitting: Family Medicine

## 2013-07-27 ENCOUNTER — Encounter: Payer: Self-pay | Admitting: Family Medicine

## 2013-07-27 VITALS — BP 108/68 | HR 79 | Temp 99.0°F | Wt 137.0 lb

## 2013-07-27 DIAGNOSIS — M545 Low back pain, unspecified: Secondary | ICD-10-CM

## 2013-07-27 DIAGNOSIS — M47817 Spondylosis without myelopathy or radiculopathy, lumbosacral region: Secondary | ICD-10-CM | POA: Insufficient documentation

## 2013-07-27 DIAGNOSIS — M5137 Other intervertebral disc degeneration, lumbosacral region: Secondary | ICD-10-CM | POA: Insufficient documentation

## 2013-07-27 DIAGNOSIS — M51379 Other intervertebral disc degeneration, lumbosacral region without mention of lumbar back pain or lower extremity pain: Secondary | ICD-10-CM | POA: Insufficient documentation

## 2013-07-27 MED ORDER — HYDROCODONE-ACETAMINOPHEN 5-325 MG PO TABS: 1.0000 | ORAL_TABLET | Freq: Four times a day (QID) | ORAL | Status: DC | PRN

## 2013-07-27 NOTE — Progress Notes (Signed)
  Subjective:    Angela Pineda is a 52 y.o. female who presents for follow up of low back problems. Current symptoms include: pain in R buttock (burning and numbing in character; 8/10 in severity) and weakness in r leg. Symptoms have significantly worsened from the previous visit. Exacerbating factors identified by the patient are bending backwards, bending forwards, sitting, standing and walking.  The following portions of the patient's history were reviewed and updated as appropriate: allergies, current medications, past family history, past medical history, past social history, past surgical history and problem list.    Objective:    BP 108/68  Pulse 79  Temp(Src) 99 F (37.2 C) (Oral)  Wt 137 lb (62.143 kg)  BMI 25.05 kg/m2  SpO2 96% General appearance: alert, cooperative, appears stated age and mild distress Extremities: extremities normal, atraumatic, no cyanosis or edema Neurologic: Alert and oriented X 3, normal strength and tone. Normal symmetric reflexes. Normal coordination and gait Motor: dec strength in R toe with plantar and dorsiflexion,  dec strength R hip and low leg Reflexes: 2+ and symmetric Coordination: normal Gait: Antalgic    Assessment:    Nonspecific acute low back pain    Plan:    Educational material distributed. Short (2-4 day) period of relative rest recommended until acute symptoms improve. Ice to affected area as needed for local pain relief. Heat to affected area as needed for local pain relief. Muscle relaxants per medication orders. Follow-up in 2 weeks.

## 2013-07-27 NOTE — Patient Instructions (Addendum)
Back Pain, Adult  Low back pain is very common. About 1 in 5 people have back pain. The cause of low back pain is rarely dangerous. The pain often gets better over time. About half of people with a sudden onset of back pain feel better in just 2 weeks. About 8 in 10 people feel better by 6 weeks.   CAUSES  Some common causes of back pain include:  · Strain of the muscles or ligaments supporting the spine.  · Wear and tear (degeneration) of the spinal discs.  · Arthritis.  · Direct injury to the back.  DIAGNOSIS  Most of the time, the direct cause of low back pain is not known. However, back pain can be treated effectively even when the exact cause of the pain is unknown. Answering your caregiver's questions about your overall health and symptoms is one of the most accurate ways to make sure the cause of your pain is not dangerous. If your caregiver needs more information, he or she may order lab work or imaging tests (X-rays or MRIs). However, even if imaging tests show changes in your back, this usually does not require surgery.  HOME CARE INSTRUCTIONS  For many people, back pain returns. Since low back pain is rarely dangerous, it is often a condition that people can learn to manage on their own.   · Remain active. It is stressful on the back to sit or stand in one place. Do not sit, drive, or stand in one place for more than 30 minutes at a time. Take short walks on level surfaces as soon as pain allows. Try to increase the length of time you walk each day.  · Do not stay in bed. Resting more than 1 or 2 days can delay your recovery.  · Do not avoid exercise or work. Your body is made to move. It is not dangerous to be active, even though your back may hurt. Your back will likely heal faster if you return to being active before your pain is gone.  · Pay attention to your body when you  bend and lift. Many people have less discomfort when lifting if they bend their knees, keep the load close to their bodies, and  avoid twisting. Often, the most comfortable positions are those that put less stress on your recovering back.  · Find a comfortable position to sleep. Use a firm mattress and lie on your side with your knees slightly bent. If you lie on your back, put a pillow under your knees.  · Only take over-the-counter or prescription medicines as directed by your caregiver. Over-the-counter medicines to reduce pain and inflammation are often the most helpful. Your caregiver may prescribe muscle relaxant drugs. These medicines help dull your pain so you can more quickly return to your normal activities and healthy exercise.  · Put ice on the injured area.  · Put ice in a plastic bag.  · Place a towel between your skin and the bag.  · Leave the ice on for 15-20 minutes, 3-4 times a day for the first 2 to 3 days. After that, ice and heat may be alternated to reduce pain and spasms.  · Ask your caregiver about trying back exercises and gentle massage. This may be of some benefit.  · Avoid feeling anxious or stressed. Stress increases muscle tension and can worsen back pain. It is important to recognize when you are anxious or stressed and learn ways to manage it. Exercise is a great option.  SEEK MEDICAL CARE IF:  · You have pain that is not relieved with rest or   medicine.  · You have pain that does not improve in 1 week.  · You have new symptoms.  · You are generally not feeling well.  SEEK IMMEDIATE MEDICAL CARE IF:   · You have pain that radiates from your back into your legs.  · You develop new bowel or bladder control problems.  · You have unusual weakness or numbness in your arms or legs.  · You develop nausea or vomiting.  · You develop abdominal pain.  · You feel faint.  Document Released: 12/03/2005 Document Revised: 06/03/2012 Document Reviewed: 04/23/2011  Exit[employer]® Patient Information ©2014 [employer].

## 2013-07-28 ENCOUNTER — Encounter: Payer: Self-pay | Admitting: Family Medicine

## 2013-07-29 ENCOUNTER — Encounter: Payer: Self-pay | Admitting: Family Medicine

## 2013-07-29 ENCOUNTER — Ambulatory Visit: Payer: No Typology Code available for payment source | Attending: Family Medicine | Admitting: Rehabilitation

## 2013-07-29 ENCOUNTER — Other Ambulatory Visit: Payer: Self-pay | Admitting: Family Medicine

## 2013-07-29 DIAGNOSIS — M545 Low back pain, unspecified: Secondary | ICD-10-CM | POA: Insufficient documentation

## 2013-07-29 DIAGNOSIS — IMO0001 Reserved for inherently not codable concepts without codable children: Secondary | ICD-10-CM | POA: Insufficient documentation

## 2013-07-29 DIAGNOSIS — M549 Dorsalgia, unspecified: Secondary | ICD-10-CM

## 2013-07-30 ENCOUNTER — Ambulatory Visit: Payer: No Typology Code available for payment source | Admitting: Rehabilitation

## 2013-07-30 ENCOUNTER — Encounter: Payer: Self-pay | Admitting: Family Medicine

## 2013-07-31 ENCOUNTER — Ambulatory Visit (INDEPENDENT_AMBULATORY_CARE_PROVIDER_SITE_OTHER): Payer: Federal, State, Local not specified - PPO | Admitting: Family Medicine

## 2013-07-31 ENCOUNTER — Encounter: Payer: Self-pay | Admitting: Family Medicine

## 2013-07-31 VITALS — BP 120/80 | HR 85 | Temp 98.4°F | Wt 126.0 lb

## 2013-07-31 DIAGNOSIS — M545 Low back pain, unspecified: Secondary | ICD-10-CM

## 2013-07-31 DIAGNOSIS — IMO0002 Reserved for concepts with insufficient information to code with codable children: Secondary | ICD-10-CM

## 2013-07-31 DIAGNOSIS — M5410 Radiculopathy, site unspecified: Secondary | ICD-10-CM

## 2013-07-31 MED ORDER — HYDROCODONE-ACETAMINOPHEN 5-325 MG PO TABS: 1.0000 | ORAL_TABLET | Freq: Four times a day (QID) | ORAL | Status: DC | PRN

## 2013-07-31 NOTE — Patient Instructions (Addendum)
We will call you with your orthopedic appt Continue the Hydrocodone Use the Robaxin Complete the Prednisone Go for your MRI as scheduled Go to the ER if your pain is not controlled over the weekend Hang in there!
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
## 2013-08-01 ENCOUNTER — Encounter (HOSPITAL_BASED_OUTPATIENT_CLINIC_OR_DEPARTMENT_OTHER): Payer: Self-pay | Admitting: *Deleted

## 2013-08-01 ENCOUNTER — Emergency Department (HOSPITAL_BASED_OUTPATIENT_CLINIC_OR_DEPARTMENT_OTHER)
Admission: EM | Admit: 2013-08-01 | Discharge: 2013-08-01 | Disposition: A | Discharge status: Home / Self Care | Payer: Federal, State, Local not specified - PPO | Attending: Emergency Medicine | Admitting: Emergency Medicine

## 2013-08-01 DIAGNOSIS — G8929 Other chronic pain: Secondary | ICD-10-CM

## 2013-08-01 DIAGNOSIS — G8921 Chronic pain due to trauma: Secondary | ICD-10-CM | POA: Insufficient documentation

## 2013-08-01 DIAGNOSIS — M545 Low back pain, unspecified: Secondary | ICD-10-CM | POA: Insufficient documentation

## 2013-08-01 DIAGNOSIS — F410 Panic disorder [episodic paroxysmal anxiety] without agoraphobia: Secondary | ICD-10-CM | POA: Insufficient documentation

## 2013-08-01 DIAGNOSIS — F17200 Nicotine dependence, unspecified, uncomplicated: Secondary | ICD-10-CM | POA: Insufficient documentation

## 2013-08-01 DIAGNOSIS — Z8709 Personal history of other diseases of the respiratory system: Secondary | ICD-10-CM | POA: Insufficient documentation

## 2013-08-01 DIAGNOSIS — M542 Cervicalgia: Secondary | ICD-10-CM | POA: Insufficient documentation

## 2013-08-01 DIAGNOSIS — Z79899 Other long term (current) drug therapy: Secondary | ICD-10-CM | POA: Insufficient documentation

## 2013-08-01 DIAGNOSIS — Z87828 Personal history of other (healed) physical injury and trauma: Secondary | ICD-10-CM | POA: Insufficient documentation

## 2013-08-01 MED ORDER — HYDROMORPHONE HCL PF 2 MG/ML IJ SOLN
2.0000 mg | Freq: Once | INTRAMUSCULAR | Status: AC
  Administered 2013-08-01: 2 mg via INTRAMUSCULAR
  Filled 2013-08-01: qty 1
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
## 2013-08-02 ENCOUNTER — Encounter: Payer: Self-pay | Admitting: Family Medicine

## 2013-08-03 ENCOUNTER — Telehealth: Payer: Self-pay | Admitting: General Practice

## 2013-08-03 ENCOUNTER — Encounter: Payer: Self-pay | Admitting: Family Medicine

## 2013-08-03 DIAGNOSIS — M545 Low back pain: Secondary | ICD-10-CM

## 2013-08-03 NOTE — Telephone Encounter (Signed)
Dr Beverely Low put ortho referral in ---  May be we can get her into them quickly and if MRI needed with sedation they can order it-

## 2013-08-03 NOTE — Telephone Encounter (Signed)
Patient wants to be sedated and would like to have the MRI at [facility]. I called and tried to get an appointment scheduled but the Tech advise the nurses were not avail and they would call me in the morning.      KP

## 2013-08-03 NOTE — Telephone Encounter (Signed)
Eber Jones from [facility] called in regards to the pt. State that the pt is severely claustrophobic was unable to have MRI completed in the past even with liquid Valium. Please advise.

## 2013-08-03 NOTE — Telephone Encounter (Signed)
Spoke with Bonita Quin and she stated patient may need to go to [facility] to be sedated.  Please advise      KP

## 2013-08-03 NOTE — Telephone Encounter (Signed)
Can an open MRI be done-?  That would have to be done at [facility] imaging , I believe

## 2013-08-03 NOTE — Telephone Encounter (Signed)
MSG left for the patient to call the office. Orders have been put in for radiology and Ortho.     KP

## 2013-08-04 ENCOUNTER — Emergency Department (HOSPITAL_COMMUNITY)
Admission: EM | Admit: 2013-08-04 | Discharge: 2013-08-04 | Disposition: A | Discharge status: Home / Self Care | Payer: Federal, State, Local not specified - PPO | Attending: Emergency Medicine | Admitting: Emergency Medicine

## 2013-08-04 ENCOUNTER — Ambulatory Visit (HOSPITAL_COMMUNITY)
Admission: RE | Admit: 2013-08-04 | Discharge: 2013-08-04 | Disposition: A | Discharge status: Home / Self Care | Payer: Federal, State, Local not specified - PPO | Source: Ambulatory Visit | Attending: Family Medicine | Admitting: Family Medicine

## 2013-08-04 ENCOUNTER — Other Ambulatory Visit: Payer: Self-pay | Admitting: Family Medicine

## 2013-08-04 ENCOUNTER — Encounter (HOSPITAL_COMMUNITY): Payer: Self-pay | Admitting: Cardiology

## 2013-08-04 ENCOUNTER — Encounter (HOSPITAL_COMMUNITY): Payer: Self-pay

## 2013-08-04 ENCOUNTER — Ambulatory Visit (HOSPITAL_BASED_OUTPATIENT_CLINIC_OR_DEPARTMENT_OTHER): Payer: No Typology Code available for payment source

## 2013-08-04 DIAGNOSIS — M545 Low back pain, unspecified: Secondary | ICD-10-CM | POA: Insufficient documentation

## 2013-08-04 DIAGNOSIS — M542 Cervicalgia: Secondary | ICD-10-CM | POA: Insufficient documentation

## 2013-08-04 DIAGNOSIS — IMO0002 Reserved for concepts with insufficient information to code with codable children: Secondary | ICD-10-CM | POA: Insufficient documentation

## 2013-08-04 DIAGNOSIS — M48061 Spinal stenosis, lumbar region without neurogenic claudication: Secondary | ICD-10-CM

## 2013-08-04 DIAGNOSIS — F410 Panic disorder [episodic paroxysmal anxiety] without agoraphobia: Secondary | ICD-10-CM | POA: Insufficient documentation

## 2013-08-04 DIAGNOSIS — Z87828 Personal history of other (healed) physical injury and trauma: Secondary | ICD-10-CM | POA: Insufficient documentation

## 2013-08-04 DIAGNOSIS — J40 Bronchitis, not specified as acute or chronic: Secondary | ICD-10-CM | POA: Insufficient documentation

## 2013-08-04 DIAGNOSIS — Z79899 Other long term (current) drug therapy: Secondary | ICD-10-CM | POA: Insufficient documentation

## 2013-08-04 DIAGNOSIS — F17200 Nicotine dependence, unspecified, uncomplicated: Secondary | ICD-10-CM | POA: Insufficient documentation

## 2013-08-04 DIAGNOSIS — G8929 Other chronic pain: Secondary | ICD-10-CM | POA: Insufficient documentation

## 2013-08-04 DIAGNOSIS — R51 Headache: Secondary | ICD-10-CM | POA: Insufficient documentation

## 2013-08-04 MED ORDER — MIDAZOLAM HCL 2 MG/2ML IJ SOLN
INTRAMUSCULAR | Status: AC
  Filled 2013-08-04: qty 10

## 2013-08-04 MED ORDER — FENTANYL CITRATE 0.05 MG/ML IJ SOLN
25.0000 ug | INTRAMUSCULAR | Status: DC | PRN
  Administered 2013-08-04 (×4): 50 ug via INTRAVENOUS

## 2013-08-04 MED ORDER — HYDROMORPHONE HCL PF 1 MG/ML IJ SOLN
0.5000 mg | Freq: Once | INTRAMUSCULAR | Status: DC
  Filled 2013-08-04: qty 1

## 2013-08-04 MED ORDER — SODIUM CHLORIDE 0.9 % IV BOLUS (SEPSIS)
INTRAVENOUS | Status: AC | PRN
  Administered 2013-08-04: 500 mL via INTRAVENOUS

## 2013-08-04 MED ORDER — HYDROMORPHONE HCL PF 1 MG/ML IJ SOLN
0.5000 mg | Freq: Once | INTRAMUSCULAR | Status: AC
  Administered 2013-08-04: 0.5 mg via INTRAVENOUS

## 2013-08-04 MED ORDER — FENTANYL CITRATE 0.05 MG/ML IJ SOLN
INTRAMUSCULAR | Status: AC
  Filled 2013-08-04: qty 4

## 2013-08-04 MED ORDER — DIAZEPAM 5 MG PO TABS
5.0000 mg | ORAL_TABLET | Freq: Once | ORAL | Status: AC
  Administered 2013-08-04: 5 mg via ORAL
  Filled 2013-08-04: qty 1

## 2013-08-04 MED ORDER — MIDAZOLAM HCL 2 MG/2ML IJ SOLN
1.0000 mg | INTRAMUSCULAR | Status: DC | PRN
  Administered 2013-08-04 (×4): 1 mg via INTRAVENOUS
  Administered 2013-08-04 (×2): 2 mg via INTRAVENOUS
  Administered 2013-08-04 (×2): 1 mg via INTRAVENOUS

## 2013-08-04 NOTE — ED Notes (Signed)
Pt spoke with daughter who informed pt she was on her way to pick her up.

## 2013-08-04 NOTE — ED Notes (Signed)
Pt reports that she was here to have a MRI of her back for pain that has been present for the past couple of months. States that she started having increased back pain during the procedure. Denies any headache or vision changes. No chest pain or SOB.

## 2013-08-04 NOTE — ED Notes (Signed)
Unable to sedate because of low BP.  Dr. Alfredo Batty notified and orders received for Normal saline bolus.

## 2013-08-04 NOTE — ED Provider Notes (Signed)
CSN: [id number]     Arrival date & time 08/04/13  1547 History     First MD Initiated Contact with Patient 08/04/13 2033     Chief Complaint  Patient presents with  . Back Pain   (Consider location/radiation/quality/duration/timing/severity/associated sxs/prior Treatment) HPI History provided by pt and prior chart.  Pt presents w/ acute on chronic, non-traumatic, diffuse low back pain.  No associated fever, LE weakness/paresthesias, bowel/bladder dysfunction or abdominal pain.  No relief w/ OTC analgesics.  Per prior chart, pt has been seen for this problem in the ED multiple times in the past, most recently on 8/16.  Had an MRI lumbar spine this morning that showed mild disk protrusion at L1-L2 and L4-L5 as well as scattered facet hypertrophy throughout lumbar spine.  Pt repots that she was in so much pain following the exam that the MRI technician referred her to ED for treatment.  Her pain is typical.  Past Medical History  Diagnosis Date  . Neck pain, chronic   . Chronic headaches   . Nerve damage   . Bronchitis   . Panic attacks    Past Surgical History  Procedure Laterality Date  . Neck surgery  2002   Family History  Problem Relation Age of Onset  . Hypertension Father   . Lung cancer Mother   . Emphysema Mother     Smoker   History  Substance Use Topics  . Smoking status: Current Every Day Smoker -- 1.00 packs/day    Types: Cigarettes  . Smokeless tobacco: Never Used  . Alcohol Use: No   OB History   Grav Para Term Preterm Abortions TAB SAB Ect Mult Living                 Review of Systems  All other systems reviewed and are negative.    Allergies  Anaprox and Tramadol  Home Medications   Current Outpatient Rx  Name  Route  Sig  Dispense  Refill  . ALPRAZolam (XANAX) 0.25 MG tablet   Oral   Take 0.25 mg by mouth 3 (three) times daily as needed for anxiety.          Marland Kitchen FLUoxetine (PROZAC) 40 MG capsule   Oral   Take 40 mg by mouth daily.          Marland Kitchen HYDROcodone-acetaminophen (NORCO/VICODIN) 5-325 MG per tablet   Oral   Take 1 tablet by mouth every 6 (six) hours as needed for pain.         Marland Kitchen ibuprofen (ADVIL,MOTRIN) 200 MG tablet   Oral   Take 400 mg by mouth every 6 (six) hours as needed for pain.          . methocarbamol (ROBAXIN) 500 MG tablet   Oral   Take 1 tablet (500 mg total) by mouth 2 (two) times daily.   20 tablet   0   . predniSONE (DELTASONE) 20 MG tablet      3 tabs po daily x 3 days, then 2 tabs x 3 days, then 1.5 tabs x 3 days, then 1 tab x 3 days, then 0.5 tabs x 3 days   27 tablet   0    BP 95/56  Pulse 57  Temp(Src) 98.7 F (37.1 C) (Oral)  Resp 18  Ht 5\' 2"  (1.575 m)  Wt 130 lb (58.968 kg)  BMI 23.77 kg/m2  SpO2 97% Physical Exam  Nursing note and vitals reviewed. Constitutional: She is oriented to person, place, and  time. She appears well-developed and well-nourished.  Pt does not appear uncomfortable  HENT:  Head: Normocephalic and atraumatic.  Eyes:  Normal appearance  Neck: Normal range of motion.  Cardiovascular: Normal rate and regular rhythm.   Pulmonary/Chest: Effort normal and breath sounds normal.  Genitourinary:  No CVA ttp  Musculoskeletal:  Diffuse tenderness throughout lumbar region, including but no worse at mid-line. Full active ROM of LE.  Nml patellar reflexes.  No saddle anesthesia. Distal sensation intact.  2+ DP pulses.     Neurological: She is alert and oriented to person, place, and time.  Skin: Skin is warm and dry. No rash noted.  Psychiatric: She has a normal mood and affect. Her behavior is normal.    ED Course   Procedures (including critical care time)  Labs Reviewed - No data to display Mr Lumbar Spine Wo Contrast  08/04/2013   *RADIOLOGY REPORT*  Clinical Data: Low back pain extending into the hips.  MRI LUMBAR SPINE WITHOUT CONTRAST  Technique:  Multiplanar and multiecho pulse sequences of the lumbar spine were obtained without intravenous  contrast.  Comparison: Lumbar spine radiographs 07/27/2013.  Findings: Normal signal is present in the conus medullaris which terminates at L1-2, within normal limits.  Marrow signal is mildly heterogeneous without focal lesions.  Slight retrolisthesis at L1-2 is stable.  Degenerative grade 1 anterolisthesis is present at L4-5 and L5-S1.  Limited imaging of the abdomen is unremarkable.  L1-2:  Mild broad-based disc bulging is present.  There is no significant stenosis.  L2-3:  Negative.  L3-4:  Mild facet hypertrophy is present.  There is no significant stenosis.  L4-5:  Asymmetric rightward disc protrusion is present.  Facet hypertrophy is slightly worse on the right as well.  This leads to mild right lateral recess and foraminal narrowing.  L5-S1:  There is uncovering of the disc associated with anterolisthesis.  Mild facet hypertrophy is present bilaterally. There is no significant stenosis.  IMPRESSION:  1.  Grade 1 anterolisthesis at L4-5 and L5-S1. 2.  Mild right lateral recess and foraminal stenosis at L4-5. 3.  No significant stenosis at L5-S1. 4.  Mild disc bulging at L1-2 without significant stenoses. 5.  Mild facet hypertrophy at L3-4 without significant stenosis.   Original Report Authenticated By: Marin Roberts, M.D.   1. Chronic back pain     MDM  [phone] F presents w/ acute on chronic, non-traumatic low back pain.  Referred to ED by MRI technician following exam early today.  MRI showed scattered facet hypertrophy throughout lumbar spine and mild disc protrusion at L1-L2 and L4-L5 w/out sig stenosis.  Results were shared w/ pt.  Treated pain in ED w/ 0.5mg  IV dilaudid and po valium.  Advised close f/u with PCP, ortho and pain management.  Return precautions and appropriate use of the emergency department discussed. 9:05 PM   Otilio Miu, PA-C 08/04/13 2106
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
## 2013-08-04 NOTE — ED Notes (Signed)
Scan completed, patient is crying due to pain.

## 2013-08-04 NOTE — Telephone Encounter (Signed)
Apt has been scheduled at [facility] this morning.      KP

## 2013-08-05 ENCOUNTER — Telehealth: Payer: Self-pay | Admitting: Family Medicine

## 2013-08-05 ENCOUNTER — Emergency Department (HOSPITAL_BASED_OUTPATIENT_CLINIC_OR_DEPARTMENT_OTHER)
Admission: EM | Admit: 2013-08-05 | Discharge: 2013-08-05 | Disposition: A | Discharge status: Home / Self Care | Payer: Federal, State, Local not specified - PPO | Attending: Emergency Medicine | Admitting: Emergency Medicine

## 2013-08-05 ENCOUNTER — Encounter: Payer: Self-pay | Admitting: Family Medicine

## 2013-08-05 ENCOUNTER — Encounter (HOSPITAL_BASED_OUTPATIENT_CLINIC_OR_DEPARTMENT_OTHER): Payer: Self-pay

## 2013-08-05 ENCOUNTER — Other Ambulatory Visit: Payer: Self-pay

## 2013-08-05 DIAGNOSIS — M545 Low back pain, unspecified: Secondary | ICD-10-CM | POA: Insufficient documentation

## 2013-08-05 DIAGNOSIS — Z8679 Personal history of other diseases of the circulatory system: Secondary | ICD-10-CM | POA: Insufficient documentation

## 2013-08-05 DIAGNOSIS — Z8709 Personal history of other diseases of the respiratory system: Secondary | ICD-10-CM | POA: Insufficient documentation

## 2013-08-05 DIAGNOSIS — M542 Cervicalgia: Secondary | ICD-10-CM | POA: Insufficient documentation

## 2013-08-05 DIAGNOSIS — Z9889 Other specified postprocedural states: Secondary | ICD-10-CM | POA: Insufficient documentation

## 2013-08-05 DIAGNOSIS — Z79899 Other long term (current) drug therapy: Secondary | ICD-10-CM | POA: Insufficient documentation

## 2013-08-05 DIAGNOSIS — F410 Panic disorder [episodic paroxysmal anxiety] without agoraphobia: Secondary | ICD-10-CM | POA: Insufficient documentation

## 2013-08-05 DIAGNOSIS — M5126 Other intervertebral disc displacement, lumbar region: Secondary | ICD-10-CM

## 2013-08-05 DIAGNOSIS — G8929 Other chronic pain: Secondary | ICD-10-CM | POA: Insufficient documentation

## 2013-08-05 DIAGNOSIS — M549 Dorsalgia, unspecified: Secondary | ICD-10-CM

## 2013-08-05 DIAGNOSIS — Z8669 Personal history of other diseases of the nervous system and sense organs: Secondary | ICD-10-CM | POA: Insufficient documentation

## 2013-08-05 DIAGNOSIS — F17200 Nicotine dependence, unspecified, uncomplicated: Secondary | ICD-10-CM | POA: Insufficient documentation

## 2013-08-05 MED ORDER — HYDROMORPHONE HCL PF 2 MG/ML IJ SOLN
2.0000 mg | Freq: Once | INTRAMUSCULAR | Status: AC
  Administered 2013-08-05: 2 mg via INTRAMUSCULAR

## 2013-08-05 MED ORDER — HYDROMORPHONE HCL PF 2 MG/ML IJ SOLN
2.0000 mg | Freq: Once | INTRAMUSCULAR | Status: DC
  Filled 2013-08-05: qty 1

## 2013-08-05 MED ORDER — ONDANSETRON HCL 4 MG/2ML IJ SOLN
4.0000 mg | Freq: Once | INTRAMUSCULAR | Status: AC
  Administered 2013-08-05: 4 mg via INTRAMUSCULAR
  Filled 2013-08-05: qty 2

## 2013-08-05 MED ORDER — HYDROCODONE-ACETAMINOPHEN 5-325 MG PO TABS: 1.0000 | ORAL_TABLET | Freq: Four times a day (QID) | ORAL | Status: DC | PRN

## 2013-08-05 NOTE — ED Notes (Signed)
Pt had an MRI yesterday, has been seen by PMD and takes Norco for pain, however took last pill this am.

## 2013-08-05 NOTE — Telephone Encounter (Signed)
Patient Information:  Caller Name: Chequita  Phone: [phone]  Patient: Angela Pineda, Angela Pineda  Gender: Female  DOB: Nov 23, 1961  Age: 52 Years  PCP: Lelon Perla.  Pregnant: No  Office Follow Up:  Does the office need to follow up with this patient?: Yes  Instructions For The Office: OFFICE, pt is requesting pain medication to be called in for her  RN Note:  pain is radiating to her bottom and thighs.  Pt states her pain is severe and she is unable to walk.  Pt did not want to come in for an appt but is requesting pain medication  Symptoms  Reason For Call & Symptoms: lower back pain.  Injuried back in May and states the pain is getting worse.  Pt states she has been seen by Dr Laury Axon for the pain (07/27/13). Pt states she had an MRI on 08/04/13. (After looking at her chart in EPIC, pt was seen in the ER on 08/04/13 for back pain and had MRI done then)  Reviewed Health History In EMR: Yes  Reviewed Medications In EMR: Yes  Reviewed Allergies In EMR: Yes  Reviewed Surgeries / Procedures: Yes  Date of Onset of Symptoms: Unknown  Treatments Tried: Pt is out of Norco and Robaxin  Treatments Tried Worked: No OB / GYN:  LMP: Unknown  Guideline(s) Used:  Back Pain  Disposition Per Guideline:   Go to Office Now  Reason For Disposition Reached:   Severe back pain  Advice Given:  N/A  Patient Refused Recommendation:  Patient Requests Prescription  Pt is requesting pain medication

## 2013-08-05 NOTE — ED Provider Notes (Signed)
Medical screening examination/treatment/procedure(s) were performed by non-physician practitioner and as supervising physician I was immediately available for consultation/collaboration.   Enid Skeens, MD 08/05/13 [phone]

## 2013-08-05 NOTE — Telephone Encounter (Signed)
Discussed recommendations with patient and she voiced understanding.    KP

## 2013-08-05 NOTE — Telephone Encounter (Signed)
Rx sent.   I called to discuss the below recommendations per Dr.Lowne, MSG left to call the office       KP

## 2013-08-05 NOTE — Telephone Encounter (Signed)
If she can't walk she may need to go to er -- we can refill meds x1 If she is requesting something different she needs to [facility]

## 2013-08-05 NOTE — ED Provider Notes (Signed)
CSN: [id number]     Arrival date & time 08/05/13  1628 History     First MD Initiated Contact with Patient 08/05/13 1634     Chief Complaint  Patient presents with  . Back Pain   (Consider location/radiation/quality/duration/timing/severity/associated sxs/prior Treatment) Patient is a 52 y.o. female presenting with back pain. The history is provided by the patient. No language interpreter was used.  Back Pain Location:  Lumbar spine Quality:  Aching Pain severity:  Severe Pain is:  Same all the time Timing:  Constant Progression:  Worsening Associated symptoms: no perianal numbness and no weakness   Risk factors: not pregnant     Past Medical History  Diagnosis Date  . Neck pain, chronic   . Chronic headaches   . Nerve damage   . Bronchitis   . Panic attacks    Past Surgical History  Procedure Laterality Date  . Neck surgery  2002   Family History  Problem Relation Age of Onset  . Hypertension Father   . Lung cancer Mother   . Emphysema Mother     Smoker   History  Substance Use Topics  . Smoking status: Current Every Day Smoker -- 1.00 packs/day    Types: Cigarettes  . Smokeless tobacco: Never Used  . Alcohol Use: No   OB History   Grav Para Term Preterm Abortions TAB SAB Ect Mult Living                 Review of Systems  Musculoskeletal: Positive for back pain.  Neurological: Negative for weakness.    Allergies  Anaprox and Tramadol  Home Medications   Current Outpatient Rx  Name  Route  Sig  Dispense  Refill  . ALPRAZolam (XANAX) 0.25 MG tablet   Oral   Take 0.25 mg by mouth 3 (three) times daily as needed for anxiety.          Marland Kitchen FLUoxetine (PROZAC) 40 MG capsule   Oral   Take 40 mg by mouth daily.         Marland Kitchen HYDROcodone-acetaminophen (NORCO/VICODIN) 5-325 MG per tablet   Oral   Take 1 tablet by mouth every 6 (six) hours as needed for pain.   30 tablet   0   . ibuprofen (ADVIL,MOTRIN) 200 MG tablet   Oral   Take 400 mg by mouth  every 6 (six) hours as needed for pain.          . methocarbamol (ROBAXIN) 500 MG tablet   Oral   Take 1 tablet (500 mg total) by mouth 2 (two) times daily.   20 tablet   0   . predniSONE (DELTASONE) 20 MG tablet      3 tabs po daily x 3 days, then 2 tabs x 3 days, then 1.5 tabs x 3 days, then 1 tab x 3 days, then 0.5 tabs x 3 days   27 tablet   0    BP 114/64  Pulse 58  Temp(Src) 98.6 F (37 C) (Oral)  Resp 16  Ht 5\' 2"  (1.575 m)  Wt 130 lb (58.968 kg)  BMI 23.77 kg/m2  SpO2 98% Physical Exam  Vitals reviewed. Constitutional: She appears well-developed and well-nourished.  HENT:  Head: Normocephalic.  Eyes: Pupils are equal, round, and reactive to light.  Neck: Normal range of motion.  Cardiovascular: Normal rate and regular rhythm.   Pulmonary/Chest: Effort normal and breath sounds normal.  Abdominal: Soft.  Musculoskeletal: Normal range of motion.  Diffusely tender  ls spine  Neurological: She is alert.  Skin: Skin is warm.  Psychiatric: She has a normal mood and affect.    ED Course   Procedures (including critical care time)  Labs Reviewed - No data to display Mr Lumbar Spine Wo Contrast  08/04/2013   *RADIOLOGY REPORT*  Clinical Data: Low back pain extending into the hips.  MRI LUMBAR SPINE WITHOUT CONTRAST  Technique:  Multiplanar and multiecho pulse sequences of the lumbar spine were obtained without intravenous contrast.  Comparison: Lumbar spine radiographs 07/27/2013.  Findings: Normal signal is present in the conus medullaris which terminates at L1-2, within normal limits.  Marrow signal is mildly heterogeneous without focal lesions.  Slight retrolisthesis at L1-2 is stable.  Degenerative grade 1 anterolisthesis is present at L4-5 and L5-S1.  Limited imaging of the abdomen is unremarkable.  L1-2:  Mild broad-based disc bulging is present.  There is no significant stenosis.  L2-3:  Negative.  L3-4:  Mild facet hypertrophy is present.  There is no significant  stenosis.  L4-5:  Asymmetric rightward disc protrusion is present.  Facet hypertrophy is slightly worse on the right as well.  This leads to mild right lateral recess and foraminal narrowing.  L5-S1:  There is uncovering of the disc associated with anterolisthesis.  Mild facet hypertrophy is present bilaterally. There is no significant stenosis.  IMPRESSION:  1.  Grade 1 anterolisthesis at L4-5 and L5-S1. 2.  Mild right lateral recess and foraminal stenosis at L4-5. 3.  No significant stenosis at L5-S1. 4.  Mild disc bulging at L1-2 without significant stenoses. 5.  Mild facet hypertrophy at L3-4 without significant stenosis.   Original Report Authenticated By: Marin Roberts, M.D.   1. Back pain     MDM  Pt given dilaudid and zofran.   Pt advised to continue home medications  Elson Areas, [place] 08/05/13 1714

## 2013-08-05 NOTE — ED Notes (Signed)
C/o lower back pain since [facility] May 2014-is being seen PCP Lownes-had MRI yesterday at [facility] yesterday-has ortho "Cleophas Dunker" appt tomorrow-states pain is no different-out of norco and was advised by PCP to come to ED-pt with hunched over steady gait to tx area

## 2013-08-06 ENCOUNTER — Telehealth: Payer: Self-pay | Admitting: Family Medicine

## 2013-08-06 DIAGNOSIS — M5410 Radiculopathy, site unspecified: Secondary | ICD-10-CM | POA: Insufficient documentation

## 2013-08-06 NOTE — Telephone Encounter (Signed)
To MD for recommendations

## 2013-08-06 NOTE — Telephone Encounter (Signed)
Discussed with patient and she wants to get a work note, she has not been to work since last week. Her Ortho apt is next Tues. Please advise         KP

## 2013-08-06 NOTE — Telephone Encounter (Signed)
Patient is calling back with work fax#: [phone]. Does not need to be faxed to anyone's attention, per pt. She wants to verify that we understand she has not been to work since 07/27/13.

## 2013-08-06 NOTE — Assessment & Plan Note (Signed)
New to provider, ongoing for pt.  Some suspicion as to pt's change in story along w/ high narcotic use and multiple refills/ER visits.  Pt has 6 separate entries in controlled substance database w/ different addresses.  Will refer to ortho.  Encouraged pt to finish pred taper as directed, continue muscle relaxers and will give enough pain meds to get pt to upcoming MRI.  Pt expressed understanding and is in agreement w/ plan.

## 2013-08-06 NOTE — Telephone Encounter (Signed)
Patient was referred over to Orthopedics for her low back pain. She says that her appointment was scheduled for today but she ended up missing the appointment this morning because her GPS took her to the wrong location. Patient says that they have rescheduled her for Tuesday next week, however, she does not feel she can wait until that long to be seen because she is in too much pain. Patient wants to know what we would advise for her.

## 2013-08-06 NOTE — ED Provider Notes (Signed)
History/physical exam/procedure(s) were performed by non-physician practitioner and as supervising physician I was immediately available for consultation/collaboration. I have reviewed all notes and am in agreement with care and plan.   Lauria Depoy S Hakim Minniefield, MD 08/06/13 1443 

## 2013-08-06 NOTE — Telephone Encounter (Signed)
Write a note to go through Tues---she will need to get one from them for Tuesday on

## 2013-08-06 NOTE — Telephone Encounter (Signed)
There is nothing else I can do--- she had an appointment today--- she can call them and ask to be put on a cancellation list.

## 2013-08-07 ENCOUNTER — Encounter: Payer: Self-pay | Admitting: Family Medicine

## 2013-08-07 ENCOUNTER — Other Ambulatory Visit: Payer: Self-pay | Admitting: Family Medicine

## 2013-08-07 DIAGNOSIS — M549 Dorsalgia, unspecified: Secondary | ICD-10-CM

## 2013-08-07 MED ORDER — METHOCARBAMOL 500 MG PO TABS: 500.0000 mg | ORAL_TABLET | Freq: Two times a day (BID) | ORAL | Status: DC

## 2013-08-07 NOTE — Telephone Encounter (Signed)
Letter left at check in.      KP 

## 2013-08-10 ENCOUNTER — Other Ambulatory Visit: Payer: Self-pay | Admitting: Family Medicine

## 2013-08-10 ENCOUNTER — Encounter: Payer: Self-pay | Admitting: Family Medicine

## 2013-08-10 MED ORDER — HYDROCODONE-ACETAMINOPHEN 5-325 MG PO TABS: 1.0000 | ORAL_TABLET | Freq: Four times a day (QID) | ORAL | Status: DC | PRN

## 2013-08-10 NOTE — Telephone Encounter (Signed)
i will refill but i think her ortho appointment is tomorrow--- she will need to get any further refills from them

## 2013-08-10 NOTE — Telephone Encounter (Signed)
Last filled 08/05/13 #30. Please advise     KP

## 2013-08-11 ENCOUNTER — Encounter: Payer: Self-pay | Admitting: Family Medicine

## 2013-08-11 ENCOUNTER — Ambulatory Visit: Payer: Federal, State, Local not specified - PPO | Admitting: Family Medicine

## 2013-08-18 ENCOUNTER — Other Ambulatory Visit: Payer: Self-pay | Admitting: Family Medicine

## 2013-08-19 MED ORDER — HYDROCODONE-ACETAMINOPHEN 5-325 MG PO TABS: 1.0000 | ORAL_TABLET | Freq: Four times a day (QID) | ORAL | Status: DC | PRN

## 2013-08-19 NOTE — Telephone Encounter (Signed)
Last seen 07/27/13 and filled 08/10/13 #30.  Contract signed. No UDS on file. Please advise      KP

## 2013-08-25 ENCOUNTER — Encounter (HOSPITAL_BASED_OUTPATIENT_CLINIC_OR_DEPARTMENT_OTHER): Payer: Self-pay | Admitting: *Deleted

## 2013-08-25 ENCOUNTER — Emergency Department (HOSPITAL_BASED_OUTPATIENT_CLINIC_OR_DEPARTMENT_OTHER)
Admission: EM | Admit: 2013-08-25 | Discharge: 2013-08-25 | Disposition: A | Discharge status: Home / Self Care | Payer: Federal, State, Local not specified - PPO | Attending: Emergency Medicine | Admitting: Emergency Medicine

## 2013-08-25 ENCOUNTER — Emergency Department (HOSPITAL_BASED_OUTPATIENT_CLINIC_OR_DEPARTMENT_OTHER): Payer: Federal, State, Local not specified - PPO

## 2013-08-25 DIAGNOSIS — IMO0002 Reserved for concepts with insufficient information to code with codable children: Secondary | ICD-10-CM | POA: Insufficient documentation

## 2013-08-25 DIAGNOSIS — R296 Repeated falls: Secondary | ICD-10-CM | POA: Insufficient documentation

## 2013-08-25 DIAGNOSIS — G8929 Other chronic pain: Secondary | ICD-10-CM | POA: Insufficient documentation

## 2013-08-25 DIAGNOSIS — Z8709 Personal history of other diseases of the respiratory system: Secondary | ICD-10-CM | POA: Insufficient documentation

## 2013-08-25 DIAGNOSIS — M545 Low back pain: Secondary | ICD-10-CM

## 2013-08-25 DIAGNOSIS — Y939 Activity, unspecified: Secondary | ICD-10-CM | POA: Insufficient documentation

## 2013-08-25 DIAGNOSIS — F410 Panic disorder [episodic paroxysmal anxiety] without agoraphobia: Secondary | ICD-10-CM | POA: Insufficient documentation

## 2013-08-25 DIAGNOSIS — Y929 Unspecified place or not applicable: Secondary | ICD-10-CM | POA: Insufficient documentation

## 2013-08-25 DIAGNOSIS — Z79899 Other long term (current) drug therapy: Secondary | ICD-10-CM | POA: Insufficient documentation

## 2013-08-25 DIAGNOSIS — F17200 Nicotine dependence, unspecified, uncomplicated: Secondary | ICD-10-CM | POA: Insufficient documentation

## 2013-08-25 MED ORDER — DIAZEPAM 5 MG/ML IJ SOLN
5.0000 mg | Freq: Once | INTRAMUSCULAR | Status: AC
  Administered 2013-08-25: 5 mg via INTRAMUSCULAR
  Filled 2013-08-25: qty 2

## 2013-08-25 MED ORDER — HYDROMORPHONE HCL PF 1 MG/ML IJ SOLN
1.0000 mg | Freq: Once | INTRAMUSCULAR | Status: AC
  Administered 2013-08-25: 1 mg via INTRAMUSCULAR
  Filled 2013-08-25: qty 1

## 2013-08-25 MED ORDER — METHOCARBAMOL 500 MG PO TABS: 500.0000 mg | ORAL_TABLET | Freq: Two times a day (BID) | ORAL | Status: DC

## 2013-08-25 MED ORDER — HYDROCODONE-ACETAMINOPHEN 5-325 MG PO TABS: 2.0000 | ORAL_TABLET | ORAL | Status: DC | PRN

## 2013-08-25 NOTE — ED Notes (Signed)
Patient is resting comfortably. 
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
## 2013-08-25 NOTE — ED Notes (Signed)
Pt given drink 

## 2013-08-27 NOTE — ED Provider Notes (Signed)
Medical screening examination/treatment/procedure(s) were performed by non-physician practitioner and as supervising physician I was immediately available for consultation/collaboration.    Yates Weisgerber J. Alaila Pillard, MD 08/27/13 1518 

## 2013-08-28 ENCOUNTER — Encounter: Payer: Self-pay | Admitting: Family Medicine

## 2013-08-28 ENCOUNTER — Other Ambulatory Visit: Payer: Self-pay | Admitting: Family Medicine

## 2013-08-31 ENCOUNTER — Other Ambulatory Visit: Payer: Self-pay | Admitting: Family Medicine

## 2013-08-31 MED ORDER — HYDROCODONE-ACETAMINOPHEN 5-325 MG PO TABS: 1.0000 | ORAL_TABLET | Freq: Four times a day (QID) | ORAL | Status: DC | PRN

## 2013-08-31 NOTE — Telephone Encounter (Signed)
Last filled 08/19/13 #30 and last seen 07/27/13. Please advise     KP

## 2013-08-31 NOTE — Telephone Encounter (Signed)
Rx sent      KP 

## 2013-09-01 ENCOUNTER — Encounter: Payer: Self-pay | Admitting: Family Medicine

## 2013-09-02 ENCOUNTER — Encounter: Payer: Self-pay | Admitting: Family Medicine

## 2013-09-06 ENCOUNTER — Other Ambulatory Visit: Payer: Self-pay | Admitting: Family Medicine

## 2013-09-07 MED ORDER — HYDROCODONE-ACETAMINOPHEN 5-325 MG PO TABS: 1.0000 | ORAL_TABLET | Freq: Four times a day (QID) | ORAL | Status: DC | PRN

## 2013-09-07 NOTE — Telephone Encounter (Signed)
Last seen 05/27/13 and filled 08/28/13 #30. Please advise      KP

## 2013-09-13 ENCOUNTER — Encounter: Payer: Self-pay | Admitting: Family Medicine

## 2013-09-14 ENCOUNTER — Other Ambulatory Visit: Payer: Self-pay | Admitting: Family Medicine

## 2013-09-14 ENCOUNTER — Encounter: Payer: Self-pay | Admitting: Family Medicine

## 2013-09-14 DIAGNOSIS — M549 Dorsalgia, unspecified: Secondary | ICD-10-CM

## 2013-09-14 MED ORDER — HYDROCODONE-ACETAMINOPHEN 5-325 MG PO TABS: 1.0000 | ORAL_TABLET | Freq: Four times a day (QID) | ORAL | Status: DC | PRN

## 2013-09-14 MED ORDER — METHOCARBAMOL 500 MG PO TABS: 500.0000 mg | ORAL_TABLET | Freq: Two times a day (BID) | ORAL | Status: DC

## 2013-09-14 NOTE — Telephone Encounter (Signed)
Last seen 07/27/13 and filled 09/07/13 #30. Please advise             KP

## 2013-09-17 ENCOUNTER — Emergency Department (HOSPITAL_BASED_OUTPATIENT_CLINIC_OR_DEPARTMENT_OTHER)
Admission: EM | Admit: 2013-09-17 | Discharge: 2013-09-17 | Disposition: A | Discharge status: Home / Self Care | Payer: Federal, State, Local not specified - PPO | Attending: Emergency Medicine | Admitting: Emergency Medicine

## 2013-09-17 ENCOUNTER — Encounter (HOSPITAL_BASED_OUTPATIENT_CLINIC_OR_DEPARTMENT_OTHER): Payer: Self-pay | Admitting: *Deleted

## 2013-09-17 DIAGNOSIS — F410 Panic disorder [episodic paroxysmal anxiety] without agoraphobia: Secondary | ICD-10-CM | POA: Insufficient documentation

## 2013-09-17 DIAGNOSIS — Z79899 Other long term (current) drug therapy: Secondary | ICD-10-CM | POA: Insufficient documentation

## 2013-09-17 DIAGNOSIS — Z87828 Personal history of other (healed) physical injury and trauma: Secondary | ICD-10-CM | POA: Insufficient documentation

## 2013-09-17 DIAGNOSIS — F17200 Nicotine dependence, unspecified, uncomplicated: Secondary | ICD-10-CM | POA: Insufficient documentation

## 2013-09-17 DIAGNOSIS — IMO0002 Reserved for concepts with insufficient information to code with codable children: Secondary | ICD-10-CM | POA: Insufficient documentation

## 2013-09-17 DIAGNOSIS — M545 Low back pain, unspecified: Secondary | ICD-10-CM | POA: Insufficient documentation

## 2013-09-17 DIAGNOSIS — G8929 Other chronic pain: Secondary | ICD-10-CM | POA: Insufficient documentation

## 2013-09-17 DIAGNOSIS — M549 Dorsalgia, unspecified: Secondary | ICD-10-CM

## 2013-09-17 DIAGNOSIS — Z8709 Personal history of other diseases of the respiratory system: Secondary | ICD-10-CM | POA: Insufficient documentation

## 2013-09-17 LAB — URINALYSIS, ROUTINE W REFLEX MICROSCOPIC
Glucose, UA: NEGATIVE mg/dL
Nitrite: NEGATIVE
Protein, ur: NEGATIVE mg/dL
Urobilinogen, UA: 0.2 mg/dL (ref 0.0–1.0)

## 2013-09-17 LAB — BASIC METABOLIC PANEL
CO2: 23 mEq/L (ref 19–32)
Calcium: 10.1 mg/dL (ref 8.4–10.5)
GFR calc Af Amer: 83 mL/min — ABNORMAL LOW (ref >90)
Sodium: 137 mEq/L (ref 135–145)

## 2013-09-17 LAB — URINE MICROSCOPIC-ADD ON

## 2013-09-17 MED ORDER — HYDROCODONE-ACETAMINOPHEN 5-325 MG PO TABS
1.0000 | ORAL_TABLET | Freq: Once | ORAL | Status: AC
  Administered 2013-09-17: 1 via ORAL
  Filled 2013-09-17: qty 1

## 2013-09-17 MED ORDER — ACETAMINOPHEN 500 MG PO TABS
1000.0000 mg | ORAL_TABLET | Freq: Once | ORAL | Status: AC
  Administered 2013-09-17: 1000 mg via ORAL
  Filled 2013-09-17: qty 2

## 2013-09-17 NOTE — ED Notes (Signed)
Pt c/o lower back pain , hx  chronic back pain

## 2013-09-17 NOTE — ED Notes (Signed)
Computer in tx room locked on med admn screen when giving tylenol-tylenol 1000mg  po was given at this time
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
## 2013-09-18 ENCOUNTER — Ambulatory Visit (INDEPENDENT_AMBULATORY_CARE_PROVIDER_SITE_OTHER): Payer: Federal, State, Local not specified - PPO | Admitting: Family Medicine

## 2013-09-18 ENCOUNTER — Encounter: Payer: Self-pay | Admitting: Family Medicine

## 2013-09-18 VITALS — BP 108/82 | HR 80 | Temp 98.9°F | Wt 129.8 lb

## 2013-09-18 DIAGNOSIS — M549 Dorsalgia, unspecified: Secondary | ICD-10-CM

## 2013-09-18 MED ORDER — CYCLOBENZAPRINE HCL 10 MG PO TABS: 10.0000 mg | ORAL_TABLET | Freq: Three times a day (TID) | ORAL | Status: DC | PRN

## 2013-09-18 MED ORDER — HYDROCODONE-ACETAMINOPHEN 5-325 MG PO TABS: ORAL_TABLET | ORAL | Status: DC

## 2013-09-18 NOTE — Patient Instructions (Addendum)
Back Pain, Adult  Low back pain is very common. About 1 in 5 people have back pain. The cause of low back pain is rarely dangerous. The pain often gets better over time. About half of people with a sudden onset of back pain feel better in just 2 weeks. About 8 in 10 people feel better by 6 weeks.   CAUSES  Some common causes of back pain include:  · Strain of the muscles or ligaments supporting the spine.  · Wear and tear (degeneration) of the spinal discs.  · Arthritis.  · Direct injury to the back.  DIAGNOSIS  Most of the time, the direct cause of low back pain is not known. However, back pain can be treated effectively even when the exact cause of the pain is unknown. Answering your caregiver's questions about your overall health and symptoms is one of the most accurate ways to make sure the cause of your pain is not dangerous. If your caregiver needs more information, he or she may order lab work or imaging tests (X-rays or MRIs). However, even if imaging tests show changes in your back, this usually does not require surgery.  HOME CARE INSTRUCTIONS  For many people, back pain returns. Since low back pain is rarely dangerous, it is often a condition that people can learn to manage on their own.   · Remain active. It is stressful on the back to sit or stand in one place. Do not sit, drive, or stand in one place for more than 30 minutes at a time. Take short walks on level surfaces as soon as pain allows. Try to increase the length of time you walk each day.  · Do not stay in bed. Resting more than 1 or 2 days can delay your recovery.  · Do not avoid exercise or work. Your body is made to move. It is not dangerous to be active, even though your back may hurt. Your back will likely heal faster if you return to being active before your pain is gone.  · Pay attention to your body when you  bend and lift. Many people have less discomfort when lifting if they bend their knees, keep the load close to their bodies, and  avoid twisting. Often, the most comfortable positions are those that put less stress on your recovering back.  · Find a comfortable position to sleep. Use a firm mattress and lie on your side with your knees slightly bent. If you lie on your back, put a pillow under your knees.  · Only take over-the-counter or prescription medicines as directed by your caregiver. Over-the-counter medicines to reduce pain and inflammation are often the most helpful. Your caregiver may prescribe muscle relaxant drugs. These medicines help dull your pain so you can more quickly return to your normal activities and healthy exercise.  · Put ice on the injured area.  · Put ice in a plastic bag.  · Place a towel between your skin and the bag.  · Leave the ice on for 15-20 minutes, 3-4 times a day for the first 2 to 3 days. After that, ice and heat may be alternated to reduce pain and spasms.  · Ask your caregiver about trying back exercises and gentle massage. This may be of some benefit.  · Avoid feeling anxious or stressed. Stress increases muscle tension and can worsen back pain. It is important to recognize when you are anxious or stressed and learn ways to manage it. Exercise is a great option.  SEEK MEDICAL CARE IF:  · You have pain that is not relieved with rest or   medicine.  · You have pain that does not improve in 1 week.  · You have new symptoms.  · You are generally not feeling well.  SEEK IMMEDIATE MEDICAL CARE IF:   · You have pain that radiates from your back into your legs.  · You develop new bowel or bladder control problems.  · You have unusual weakness or numbness in your arms or legs.  · You develop nausea or vomiting.  · You develop abdominal pain.  · You feel faint.  Document Released: 12/03/2005 Document Revised: 06/03/2012 Document Reviewed: 04/23/2011  Exit[employer]® Patient Information ©2014 [employer].

## 2013-09-19 ENCOUNTER — Encounter: Payer: Self-pay | Admitting: Family Medicine

## 2013-09-19 NOTE — Progress Notes (Signed)
  Subjective:    Angela Pineda is a 52 y.o. female who presents for follow up of low back problems. Current symptoms include: pain in low back (sharp and tingling in character; 10/10 in severity) and weakness in legs. Symptoms have significantly worsened from the previous visit. Exacerbating factors identified by the patient are bending forwards, sitting, standing and walking.  Pt c/o losing control of bowel and bladder---- she has called NS and was in er yesterday.  She was found to be neurologically intact.  The following portions of the patient's history were reviewed and updated as appropriate: allergies, current medications, past family history, past medical history, past social history, past surgical history and problem list.    Objective:    BP 108/82  Pulse 80  Temp(Src) 98.9 F (37.2 C) (Oral)  Wt 129 lb 12.8 oz (58.877 kg)  BMI 23.73 kg/m2  SpO2 95% General appearance: alert, cooperative, appears stated age and moderate distress Extremities: extremities normal, atraumatic, no cyanosis or edema Neurologic: Motor: grossly normal Reflexes: normal 2+ and symmetric Gait: Antalgic    Assessment:    Nonspecific acute low back pain    Plan:    Educational material distributed. Short (2-4 day) period of relative rest recommended until acute symptoms improve. Ice to affected area as needed for local pain relief. Heat to affected area as needed for local pain relief. Muscle relaxants per medication orders. f/u neurosurgery

## 2013-09-21 ENCOUNTER — Encounter: Payer: Self-pay | Admitting: Family Medicine

## 2013-09-22 ENCOUNTER — Other Ambulatory Visit: Payer: Self-pay | Admitting: Family Medicine

## 2013-09-22 DIAGNOSIS — M549 Dorsalgia, unspecified: Secondary | ICD-10-CM

## 2013-09-24 ENCOUNTER — Other Ambulatory Visit: Payer: Self-pay | Admitting: Family Medicine

## 2013-09-24 DIAGNOSIS — M549 Dorsalgia, unspecified: Secondary | ICD-10-CM

## 2013-09-25 ENCOUNTER — Encounter: Payer: Self-pay | Admitting: Family Medicine

## 2013-09-25 NOTE — Telephone Encounter (Signed)
Last seen and filled 09/18/13 #60. Please advise     KP 

## 2013-09-28 ENCOUNTER — Other Ambulatory Visit: Payer: Self-pay | Admitting: Family Medicine

## 2013-09-28 DIAGNOSIS — M549 Dorsalgia, unspecified: Secondary | ICD-10-CM

## 2013-09-30 ENCOUNTER — Encounter: Payer: Self-pay | Admitting: Family Medicine

## 2013-09-30 MED ORDER — CYCLOBENZAPRINE HCL 10 MG PO TABS: 10.0000 mg | ORAL_TABLET | Freq: Three times a day (TID) | ORAL | Status: DC | PRN

## 2013-09-30 NOTE — Telephone Encounter (Signed)
Last seen and filled 09/18/13 #30. Please advise      KP

## 2013-09-30 NOTE — Telephone Encounter (Signed)
Last seen and filled 09/18/13 #60. Please advise     KP

## 2013-09-30 NOTE — Telephone Encounter (Signed)
Too soon--should be seeing ortho soon

## 2013-10-22 ENCOUNTER — Other Ambulatory Visit: Payer: Self-pay

## 2013-11-05 ENCOUNTER — Encounter (HOSPITAL_BASED_OUTPATIENT_CLINIC_OR_DEPARTMENT_OTHER): Payer: Self-pay | Admitting: Emergency Medicine

## 2013-11-05 ENCOUNTER — Emergency Department (HOSPITAL_BASED_OUTPATIENT_CLINIC_OR_DEPARTMENT_OTHER)
Admission: EM | Admit: 2013-11-05 | Discharge: 2013-11-06 | Disposition: A | Discharge status: Home / Self Care | Payer: Federal, State, Local not specified - PPO | Attending: Emergency Medicine | Admitting: Emergency Medicine

## 2013-11-05 DIAGNOSIS — F17200 Nicotine dependence, unspecified, uncomplicated: Secondary | ICD-10-CM | POA: Insufficient documentation

## 2013-11-05 DIAGNOSIS — M545 Low back pain, unspecified: Secondary | ICD-10-CM | POA: Insufficient documentation

## 2013-11-05 DIAGNOSIS — G8929 Other chronic pain: Secondary | ICD-10-CM | POA: Insufficient documentation

## 2013-11-05 DIAGNOSIS — R52 Pain, unspecified: Secondary | ICD-10-CM | POA: Insufficient documentation

## 2013-11-05 DIAGNOSIS — Z79899 Other long term (current) drug therapy: Secondary | ICD-10-CM | POA: Insufficient documentation

## 2013-11-05 DIAGNOSIS — Z8709 Personal history of other diseases of the respiratory system: Secondary | ICD-10-CM | POA: Insufficient documentation

## 2013-11-05 DIAGNOSIS — F410 Panic disorder [episodic paroxysmal anxiety] without agoraphobia: Secondary | ICD-10-CM | POA: Insufficient documentation

## 2013-11-05 DIAGNOSIS — Z87828 Personal history of other (healed) physical injury and trauma: Secondary | ICD-10-CM | POA: Insufficient documentation

## 2013-11-05 DIAGNOSIS — N390 Urinary tract infection, site not specified: Secondary | ICD-10-CM | POA: Insufficient documentation

## 2013-11-05 LAB — URINALYSIS, ROUTINE W REFLEX MICROSCOPIC
Glucose, UA: NEGATIVE mg/dL
Hgb urine dipstick: NEGATIVE
Ketones, ur: NEGATIVE mg/dL
Protein, ur: NEGATIVE mg/dL
Urobilinogen, UA: 1 mg/dL (ref 0.0–1.0)

## 2013-11-05 LAB — URINE MICROSCOPIC-ADD ON

## 2013-11-05 MED ORDER — CEFTRIAXONE SODIUM 1 G IJ SOLR
1.0000 g | Freq: Once | INTRAMUSCULAR | Status: AC
  Administered 2013-11-05: 1 g via INTRAMUSCULAR
  Filled 2013-11-05: qty 10

## 2013-11-05 MED ORDER — KETOROLAC TROMETHAMINE 60 MG/2ML IM SOLN
60.0000 mg | Freq: Once | INTRAMUSCULAR | Status: AC
  Administered 2013-11-05: 60 mg via INTRAMUSCULAR
  Filled 2013-11-05: qty 2

## 2013-11-05 MED ORDER — NITROFURANTOIN MONOHYD MACRO 100 MG PO CAPS: 100.0000 mg | ORAL_CAPSULE | Freq: Two times a day (BID) | ORAL | Status: DC

## 2013-11-05 NOTE — ED Provider Notes (Signed)
CSN: [id number]     Arrival date & time 11/05/13  2228 History   First MD Initiated Contact with Patient 11/05/13 2259     Chief Complaint  Patient presents with  . Back Pain   (Consider location/radiation/quality/duration/timing/severity/associated sxs/prior Treatment) HPI This is a 52 year old female with chronic back pain. She is here with an acute exacerbation of pain in her right lumbar region radiating down her right leg. The pain is moderate to severe and worse with ambulation or movement. She states this started 3 days ago. There is no new numbness or weakness associated with it. She is on hydrocodone but states this is not working. She is not having any dysuria, fever or chills.  Past Medical History  Diagnosis Date  . Neck pain, chronic   . Chronic headaches   . Nerve damage   . Bronchitis   . Panic attacks    Past Surgical History  Procedure Laterality Date  . Neck surgery  2002   Family History  Problem Relation Age of Onset  . Hypertension Father   . Lung cancer Mother   . Emphysema Mother     Smoker   History  Substance Use Topics  . Smoking status: Current Every Day Smoker -- 1.00 packs/day    Types: Cigarettes  . Smokeless tobacco: Never Used  . Alcohol Use: No   OB History   Grav Para Term Preterm Abortions TAB SAB Ect Mult Living                 Review of Systems  All other systems reviewed and are negative.    Allergies  Anaprox and Tramadol  Home Medications   Current Outpatient Rx  Name  Route  Sig  Dispense  Refill  . CLONAZEPAM PO   Oral   Take by mouth.         . ALPRAZolam (XANAX) 0.25 MG tablet   Oral   Take 0.25 mg by mouth 3 (three) times daily as needed for anxiety.          . cyclobenzaprine (FLEXERIL) 10 MG tablet   Oral   Take 1 tablet (10 mg total) by mouth 3 (three) times daily as needed for muscle spasms.   30 tablet   0   . FLUoxetine (PROZAC) 40 MG capsule   Oral   Take 40 mg by mouth daily.         Marland Kitchen  HYDROcodone-acetaminophen (NORCO/VICODIN) 5-325 MG per tablet      1-2 tab po q6h prn   60 tablet   0   . ibuprofen (ADVIL,MOTRIN) 200 MG tablet   Oral   Take 400 mg by mouth every 6 (six) hours as needed for pain.           BP 101/69  Pulse 104  Temp(Src) 98.2 F (36.8 C) (Oral)  Resp 20  Ht 5\' 2"  (1.575 m)  Wt 129 lb (58.514 kg)  BMI 23.59 kg/m2  SpO2 98%  Physical Exam General: Well-developed, well-nourished female in no acute distress; appearance consistent with age of record HENT: normocephalic; atraumatic; poor dentition Eyes: pupils equal, round and reactive to light; extraocular muscles intact Neck: supple Heart: regular rate and rhythm Lungs: clear to auscultation bilaterally Abdomen: soft; nondistended; nontender; no masses or hepatosplenomegaly; bowel sounds present Back: Right paralumbar tenderness; pain on straight leg raise at 5 bilaterally Extremities: No deformity; decreased range of motion at hips due to pain; pulses normal Neurologic: Awake, alert and oriented; motor function  intact in all extremities and symmetric; no facial droop Skin: Warm and dry Psychiatric: Normal mood and affect    ED Course  Procedures (including critical care time)  MDM   Nursing notes and vitals signs, including pulse oximetry, reviewed.  Summary of this visit's results, reviewed by myself:  Labs:  Results for orders placed during the hospital encounter of 11/05/13 (from the past 24 hour(s))  URINALYSIS, ROUTINE W REFLEX MICROSCOPIC     Status: Abnormal   Collection Time    11/05/13 10:40 PM      Result Value Range   Color, Urine YELLOW  YELLOW   APPearance CLOUDY (*) CLEAR   Specific Gravity, Urine 1.022  1.005 - 1.030   pH 6.5  5.0 - 8.0   Glucose, UA NEGATIVE  NEGATIVE mg/dL   Hgb urine dipstick NEGATIVE  NEGATIVE   Bilirubin Urine NEGATIVE  NEGATIVE   Ketones, ur NEGATIVE  NEGATIVE mg/dL   Protein, ur NEGATIVE  NEGATIVE mg/dL   Urobilinogen, UA 1.0  0.0 -  1.0 mg/dL   Nitrite NEGATIVE  NEGATIVE   Leukocytes, UA LARGE (*) NEGATIVE  URINE MICROSCOPIC-ADD ON     Status: Abnormal   Collection Time    11/05/13 10:40 PM      Result Value Range   Squamous Epithelial / LPF FEW (*) RARE   WBC, UA 11-20  <3 WBC/hpf   Bacteria, UA FEW (*) RARE   The patient was advised that because she is artery on chronic narcotics we would not be able to prescribe her any additional narcotics. She stated she understood this and requested a shot of Toradol which she has had before and is known to tolerate. She was advised of her urinalysis showing evidence of a urinary tract infection and she requested a shot for this; we will give her Rocephin 1 g IM and prescribe Macrobid.     Hanley Seamen, MD 11/05/13 [phone]

## 2013-11-05 NOTE — ED Notes (Signed)
Pt. Will have wait time with injection.

## 2013-11-05 NOTE — ED Notes (Signed)
Pt sts her lower back has been hurting since Monday. When asked what she has been taking to help her pain the patient stated "nothing". When confronted by Dr. Read Drivers about her recent filling of 45 hydrocodone Rx 5 days ago, she stated that she had been taking them but they weren't helping the pain. Dr. Read Drivers explained that she would need to follow up with her PCP for her chronic pain issues. Pt stated that she was not looking for narcotics and when he suggested a shot of anti-inflammatory, the pt stated that "that would be fine". He also discussed her UTI and stated that he would treat her for that. Pt is in agreement with the POC he discussed with her.

## 2013-11-05 NOTE — ED Notes (Signed)
MD at bedside. 

## 2013-11-05 NOTE — ED Notes (Addendum)
Back pain. Hx of chronic back pain. Drove herself here.

## 2013-11-06 NOTE — ED Notes (Signed)
No rash or problems noted with injections given.

## 2013-11-07 LAB — URINE CULTURE: Colony Count: 50000

## 2014-06-13 ENCOUNTER — Encounter (HOSPITAL_BASED_OUTPATIENT_CLINIC_OR_DEPARTMENT_OTHER): Payer: Self-pay | Admitting: Emergency Medicine

## 2014-06-13 ENCOUNTER — Emergency Department (HOSPITAL_BASED_OUTPATIENT_CLINIC_OR_DEPARTMENT_OTHER)
Admission: EM | Admit: 2014-06-13 | Discharge: 2014-06-13 | Disposition: A | Discharge status: Home / Self Care | Payer: Federal, State, Local not specified - PPO | Attending: Emergency Medicine | Admitting: Emergency Medicine

## 2014-06-13 DIAGNOSIS — F410 Panic disorder [episodic paroxysmal anxiety] without agoraphobia: Secondary | ICD-10-CM | POA: Insufficient documentation

## 2014-06-13 DIAGNOSIS — M545 Low back pain, unspecified: Secondary | ICD-10-CM | POA: Insufficient documentation

## 2014-06-13 DIAGNOSIS — F17200 Nicotine dependence, unspecified, uncomplicated: Secondary | ICD-10-CM | POA: Insufficient documentation

## 2014-06-13 DIAGNOSIS — Z792 Long term (current) use of antibiotics: Secondary | ICD-10-CM | POA: Insufficient documentation

## 2014-06-13 DIAGNOSIS — Z87828 Personal history of other (healed) physical injury and trauma: Secondary | ICD-10-CM | POA: Insufficient documentation

## 2014-06-13 DIAGNOSIS — M549 Dorsalgia, unspecified: Secondary | ICD-10-CM

## 2014-06-13 DIAGNOSIS — Z8709 Personal history of other diseases of the respiratory system: Secondary | ICD-10-CM | POA: Insufficient documentation

## 2014-06-13 DIAGNOSIS — Z79899 Other long term (current) drug therapy: Secondary | ICD-10-CM | POA: Insufficient documentation

## 2014-06-13 DIAGNOSIS — G8929 Other chronic pain: Secondary | ICD-10-CM | POA: Insufficient documentation

## 2014-06-13 HISTORY — DX: Other chronic pain: G89.29

## 2014-06-13 HISTORY — DX: Dorsalgia, unspecified: M54.9

## 2014-06-13 MED ORDER — HYDROMORPHONE HCL PF 1 MG/ML IJ SOLN
1.0000 mg | Freq: Once | INTRAMUSCULAR | Status: AC
  Administered 2014-06-13: 1 mg via INTRAMUSCULAR
  Filled 2014-06-13: qty 1

## 2014-06-13 MED ORDER — CYCLOBENZAPRINE HCL 10 MG PO TABS: 10.0000 mg | ORAL_TABLET | Freq: Two times a day (BID) | ORAL | Status: DC | PRN

## 2014-06-13 NOTE — ED Provider Notes (Signed)
CSN: [id number]     Arrival date & time 06/13/14  1809 History   First MD Initiated Contact with Patient 06/13/14 1845     Chief Complaint  Patient presents with  . Back Pain     (Consider location/radiation/quality/duration/timing/severity/associated sxs/prior Treatment) Patient is a 53 y.o. female presenting with back pain. The history is provided by the patient. No language interpreter was used.  Back Pain Associated symptoms: no fever, no headaches and no numbness   Associated symptoms comment:  Low back pain that feels like an exacerbation of her chronic back pain treated through rehab by Dr. Nelva Bush with hydrocodone. No new injury. No urinary or bowel incontinence/retention. No abdominal pain. No new symptoms.    Past Medical History  Diagnosis Date  . Neck pain, chronic   . Chronic headaches   . Nerve damage   . Bronchitis   . Panic attacks   . Chronic back pain    Past Surgical History  Procedure Laterality Date  . Neck surgery  2002   Family History  Problem Relation Age of Onset  . Hypertension Father   . Lung cancer Mother   . Emphysema Mother     Smoker   History  Substance Use Topics  . Smoking status: Current Every Day Smoker -- 1.00 packs/day    Types: Cigarettes  . Smokeless tobacco: Never Used  . Alcohol Use: No   OB History   Grav Para Term Preterm Abortions TAB SAB Ect Mult Living                 Review of Systems  Constitutional: Negative for fever and chills.  Gastrointestinal: Negative.  Negative for nausea.  Genitourinary: Negative.   Musculoskeletal: Positive for back pain.  Skin: Negative.   Neurological: Negative.  Negative for numbness and headaches.      Allergies  Anaprox and Tramadol  Home Medications   Prior to Admission medications   Medication Sig Start Date End Date Taking? Authorizing Provider  ALPRAZolam (XANAX) 0.25 MG tablet Take 0.25 mg by mouth 3 (three) times daily as needed for anxiety.     Historical Provider,  MD  CLONAZEPAM PO Take by mouth.    Historical Provider, MD  cyclobenzaprine (FLEXERIL) 10 MG tablet Take 1 tablet (10 mg total) by mouth 3 (three) times daily as needed for muscle spasms. 09/30/13   Rosalita Chessman, DO  FLUoxetine (PROZAC) 40 MG capsule Take 40 mg by mouth daily.    Historical Provider, MD  HYDROcodone-acetaminophen (NORCO/VICODIN) 5-325 MG per tablet 1-2 tab po q6h prn 09/18/13   Rosalita Chessman, DO  ibuprofen (ADVIL,MOTRIN) 200 MG tablet Take 400 mg by mouth every 6 (six) hours as needed for pain.     Historical Provider, MD  nitrofurantoin, macrocrystal-monohydrate, (MACROBID) 100 MG capsule Take 1 capsule (100 mg total) by mouth 2 (two) times daily. X 7 days 11/05/13   Karen Chafe Molpus, MD   BP 133/84  Pulse 81  Temp(Src) 99.1 F (37.3 C) (Oral)  Resp 20  Ht 5\' 2"  (1.575 m)  Wt 125 lb (56.7 kg)  BMI 22.86 kg/m2  SpO2 97% Physical Exam  Constitutional: She is oriented to person, place, and time. She appears well-developed and well-nourished.  HENT:  Head: Normocephalic.  Neck: Normal range of motion. Neck supple.  Pulmonary/Chest: Effort normal.  Abdominal: Soft. Bowel sounds are normal. There is no tenderness. There is no rebound and no guarding.  Musculoskeletal: Normal range of motion.  Mild lumbar  and paralumbar tenderness without swelling or discoloration.  Neurological: She is alert and oriented to person, place, and time. She has normal reflexes. Coordination normal.  Skin: Skin is warm and dry. No rash noted.  Psychiatric: She has a normal mood and affect.    ED Course  Procedures (including critical care time) Labs Review Labs Reviewed - No data to display  Imaging Review No results found.   EKG Interpretation None      MDM   Final diagnoses:  None    1. Chronic back pain  No neurologic red flags with normal exam. IM medications given for comfort. Encouraged to follow up per scheduled appointment with dr. Nelva Bush this week. Will add muscle  relaxer    Dewaine Oats, PA-C 06/13/14 1921

## 2014-06-13 NOTE — Discharge Instructions (Signed)

## 2014-06-13 NOTE — ED Notes (Signed)
Back pain started a week ago, lower back. Headache started yesterday.

## 2014-06-13 NOTE — ED Provider Notes (Signed)
Medical screening examination/treatment/procedure(s) were performed by non-physician practitioner and as supervising physician I was immediately available for consultation/collaboration.    Dot Lanes, MD 06/13/14 [phone]

## 2014-08-02 ENCOUNTER — Encounter (HOSPITAL_BASED_OUTPATIENT_CLINIC_OR_DEPARTMENT_OTHER): Payer: Self-pay | Admitting: Emergency Medicine

## 2014-08-02 ENCOUNTER — Emergency Department (HOSPITAL_BASED_OUTPATIENT_CLINIC_OR_DEPARTMENT_OTHER)
Admission: EM | Admit: 2014-08-02 | Discharge: 2014-08-02 | Disposition: A | Discharge status: Home / Self Care | Payer: Federal, State, Local not specified - PPO | Attending: Emergency Medicine | Admitting: Emergency Medicine

## 2014-08-02 DIAGNOSIS — Z792 Long term (current) use of antibiotics: Secondary | ICD-10-CM | POA: Diagnosis not present

## 2014-08-02 DIAGNOSIS — Z791 Long term (current) use of non-steroidal anti-inflammatories (NSAID): Secondary | ICD-10-CM | POA: Insufficient documentation

## 2014-08-02 DIAGNOSIS — M545 Low back pain, unspecified: Secondary | ICD-10-CM | POA: Insufficient documentation

## 2014-08-02 DIAGNOSIS — M549 Dorsalgia, unspecified: Secondary | ICD-10-CM

## 2014-08-02 DIAGNOSIS — Z8709 Personal history of other diseases of the respiratory system: Secondary | ICD-10-CM | POA: Diagnosis not present

## 2014-08-02 DIAGNOSIS — G8929 Other chronic pain: Secondary | ICD-10-CM | POA: Diagnosis not present

## 2014-08-02 DIAGNOSIS — F17200 Nicotine dependence, unspecified, uncomplicated: Secondary | ICD-10-CM | POA: Diagnosis not present

## 2014-08-02 DIAGNOSIS — F410 Panic disorder [episodic paroxysmal anxiety] without agoraphobia: Secondary | ICD-10-CM | POA: Diagnosis not present

## 2014-08-02 MED ORDER — HYDROMORPHONE HCL PF 1 MG/ML IJ SOLN
1.0000 mg | Freq: Once | INTRAMUSCULAR | Status: AC
  Administered 2014-08-02: 1 mg via INTRAMUSCULAR
  Filled 2014-08-02: qty 1

## 2014-08-02 NOTE — Discharge Instructions (Signed)
Back Injury Prevention Back injuries can be extremely painful and difficult to heal. After having one back injury, you are much more likely to experience another later on. It is important to learn how to avoid injuring or re-injuring your back. The following tips can help you to prevent a back injury. PHYSICAL FITNESS  Exercise regularly and try to develop good tone in your abdominal muscles. Your abdominal muscles provide a lot of the support needed by your back.  Do aerobic exercises (walking, jogging, biking, swimming) regularly.  Do exercises that increase balance and strength (tai chi, yoga) regularly. This can decrease your risk of falling and injuring your back.  Stretch before and after exercising.  Maintain a healthy weight. The more you weigh, the more stress is placed on your back. For every pound of weight, 10 times that amount of pressure is placed on the back. DIET  Talk to your caregiver about how much calcium and vitamin D you need per day. These nutrients help to prevent weakening of the bones (osteoporosis). Osteoporosis can cause broken (fractured) bones that lead to back pain.  Include good sources of calcium in your diet, such as dairy products, green, leafy vegetables, and products with calcium added (fortified).  Include good sources of vitamin D in your diet, such as milk and foods that are fortified with vitamin D.  Consider taking a nutritional supplement or a multivitamin if needed.  Stop smoking if you smoke. POSTURE  Sit and stand up straight. Avoid leaning forward when you sit or hunching over when you stand.  Choose chairs with good low back (lumbar) support.  If you work at a desk, sit close to your work so you do not need to lean over. Keep your chin tucked in. Keep your neck drawn back and elbows bent at a right angle. Your arms should look like the letter "L."  Sit high and close to the steering wheel when you drive. Add a lumbar support to your car  seat if needed.  Avoid sitting or standing in one position for too long. Take breaks to get up, stretch, and walk around at least once every hour. Take breaks if you are driving for long periods of time.  Sleep on your side with your knees slightly bent, or sleep on your back with a pillow under your knees. Do not sleep on your stomach. LIFTING, TWISTING, AND REACHING  Avoid heavy lifting, especially repetitive lifting. If you must do heavy lifting:  Stretch before lifting.  Work slowly.  Rest between lifts.  Use carts and dollies to move objects when possible.  Make several small trips instead of carrying 1 heavy load.  Ask for help when you need it.  Ask for help when moving big, awkward objects.  Follow these steps when lifting:  Stand with your feet shoulder-width apart.  Get as close to the object as you can. Do not try to pick up heavy objects that are far from your body.  Use handles or lifting straps if they are available.  Bend at your knees. Squat down, but keep your heels off the floor.  Keep your shoulders pulled back, your chin tucked in, and your back straight.  Lift the object slowly, tightening the muscles in your legs, abdomen, and buttocks. Keep the object as close to the center of your body as possible.  When you put a load down, use these same guidelines in reverse.  Do not:  Lift the object above your waist.  Twist at the waist while lifting or carrying a load. Move your feet if you need to turn, not your waist.  Bend over without bending at your knees.  Avoid reaching over your head, across a table, or for an object on a high surface. OTHER TIPS  Avoid wet floors and keep sidewalks clear of ice to prevent falls.  Do not sleep on a mattress that is too soft or too hard.  Keep items that are used frequently within easy reach.  Put heavier objects on shelves at waist level and lighter objects on lower or higher shelves.  Find ways to  decrease your stress, such as exercise, massage, or relaxation techniques. Stress can build up in your muscles. Tense muscles are more vulnerable to injury.  Seek treatment for depression or anxiety if needed. These conditions can increase your risk of developing back pain. SEEK MEDICAL CARE IF:  You injure your back.  You have questions about diet, exercise, or other ways to prevent back injuries. MAKE SURE YOU:  Understand these instructions.  Will watch your condition.  Will get help right away if you are not doing well or get worse. Document Released: 01/10/2005 Document Revised: 02/25/2012 Document Reviewed: 01/14/2012 [facility] Patient Information 2015 [place], [place]. This information is not intended to replace advice given to you by your health care provider. Make sure you discuss any questions you have with your health care provider. Cryotherapy Cryotherapy means treatment with cold. Ice or gel packs can be used to reduce both pain and swelling. Ice is the most helpful within the first 24 to 48 hours after an injury or flare-up from overusing a muscle or joint. Sprains, strains, spasms, burning pain, shooting pain, and aches can all be eased with ice. Ice can also be used when recovering from surgery. Ice is effective, has very few side effects, and is safe for most people to use. PRECAUTIONS  Ice is not a safe treatment option for people with:  Raynaud phenomenon. This is a condition affecting small blood vessels in the extremities. Exposure to cold may cause your problems to return.  Cold hypersensitivity. There are many forms of cold hypersensitivity, including:  Cold urticaria. Red, itchy hives appear on the skin when the tissues begin to warm after being iced.  Cold erythema. This is a red, itchy rash caused by exposure to cold.  Cold hemoglobinuria. Red blood cells break down when the tissues begin to warm after being iced. The hemoglobin that carry oxygen are passed into  the urine because they cannot combine with blood proteins fast enough.  Numbness or altered sensitivity in the area being iced. If you have any of the following conditions, do not use ice until you have discussed cryotherapy with your caregiver:  Heart conditions, such as arrhythmia, angina, or chronic heart disease.  High blood pressure.  Healing wounds or open skin in the area being iced.  Current infections.  Rheumatoid arthritis.  Poor circulation.  Diabetes. Ice slows the blood flow in the region it is applied. This is beneficial when trying to stop inflamed tissues from spreading irritating chemicals to surrounding tissues. However, if you expose your skin to cold temperatures for too long or without the proper protection, you can damage your skin or nerves. Watch for signs of skin damage due to cold. HOME CARE INSTRUCTIONS Follow these tips to use ice and cold packs safely.  Place a dry or damp towel between the ice and skin. A damp towel will cool the  skin more quickly, so you may need to shorten the time that the ice is used.  For a more rapid response, add gentle compression to the ice.  Ice for no more than 10 to 20 minutes at a time. The bonier the area you are icing, the less time it will take to get the benefits of ice.  Check your skin after 5 minutes to make sure there are no signs of a poor response to cold or skin damage.  Rest 20 minutes or more between uses.  Once your skin is numb, you can end your treatment. You can test numbness by very lightly touching your skin. The touch should be so light that you do not see the skin dimple from the pressure of your fingertip. When using ice, most people will feel these normal sensations in this order: cold, burning, aching, and numbness.  Do not use ice on someone who cannot communicate their responses to pain, such as small children or people with dementia. HOW TO MAKE AN ICE PACK Ice packs are the most common way to  use ice therapy. Other methods include ice massage, ice baths, and cryosprays. Muscle creams that cause a cold, tingly feeling do not offer the same benefits that ice offers and should not be used as a substitute unless recommended by your caregiver. To make an ice pack, do one of the following:  Place crushed ice or a bag of frozen vegetables in a sealable plastic bag. Squeeze out the excess air. Place this bag inside another plastic bag. Slide the bag into a pillowcase or place a damp towel between your skin and the bag.  Mix 3 parts water with 1 part rubbing alcohol. Freeze the mixture in a sealable plastic bag. When you remove the mixture from the freezer, it will be slushy. Squeeze out the excess air. Place this bag inside another plastic bag. Slide the bag into a pillowcase or place a damp towel between your skin and the bag. SEEK MEDICAL CARE IF:  You develop white spots on your skin. This may give the skin a blotchy (mottled) appearance.  Your skin turns blue or pale.  Your skin becomes waxy or hard.  Your swelling gets worse. MAKE SURE YOU:   Understand these instructions.  Will watch your condition.  Will get help right away if you are not doing well or get worse. Document Released: 07/30/2011 Document Revised: 04/19/2014 Document Reviewed: 07/30/2011 [facility] Patient Information 2015 [place], [place]. This information is not intended to replace advice given to you by your health care provider. Make sure you discuss any questions you have with your health care provider.

## 2014-08-02 NOTE — ED Provider Notes (Signed)
CSN: [id number]     Arrival date & time 08/02/14  1652 History   First MD Initiated Contact with Patient 08/02/14 1702     Chief Complaint  Patient presents with  . Back Pain     (Consider location/radiation/quality/duration/timing/severity/associated sxs/prior Treatment) Patient is a 53 y.o. female presenting with back pain. The history is provided by the patient. No language interpreter was used.  Back Pain Location:  Lumbar spine Radiates to:  L thigh and R thigh Pain severity:  Severe Associated symptoms: no abdominal pain, no fever, no headaches and no numbness   Associated symptoms comment:  Patient with chronic back pain usually maintained on Norco given to her by her doctor. No history of back surgery. She denies urinary or bowel incontinence. No saddle paresthesia. No abdominal pain. She denies recent injury but reports that this is typical of a pain flare up she has intermittently.    Past Medical History  Diagnosis Date  . Neck pain, chronic   . Chronic headaches   . Nerve damage   . Bronchitis   . Panic attacks   . Chronic back pain    Past Surgical History  Procedure Laterality Date  . Neck surgery  2002   Family History  Problem Relation Age of Onset  . Hypertension Father   . Lung cancer Mother   . Emphysema Mother     Smoker   History  Substance Use Topics  . Smoking status: Current Every Day Smoker -- 1.00 packs/day    Types: Cigarettes  . Smokeless tobacco: Never Used  . Alcohol Use: No   OB History   Grav Para Term Preterm Abortions TAB SAB Ect Mult Living                 Review of Systems  Constitutional: Negative for fever and chills.  Gastrointestinal: Negative.  Negative for nausea and abdominal pain.  Genitourinary: Negative for difficulty urinating.  Musculoskeletal: Positive for back pain.  Neurological: Negative.  Negative for numbness and headaches.      Allergies  Anaprox and Tramadol  Home Medications   Prior to Admission  medications   Medication Sig Start Date End Date Taking? Authorizing Provider  ALPRAZolam (XANAX) 0.25 MG tablet Take 0.25 mg by mouth 3 (three) times daily as needed for anxiety.     Historical Provider, MD  CLONAZEPAM PO Take by mouth.    Historical Provider, MD  cyclobenzaprine (FLEXERIL) 10 MG tablet Take 1 tablet (10 mg total) by mouth 3 (three) times daily as needed for muscle spasms. 09/30/13   Rosalita Chessman, DO  cyclobenzaprine (FLEXERIL) 10 MG tablet Take 1 tablet (10 mg total) by mouth 2 (two) times daily as needed for muscle spasms. 06/13/14   Villa Burgin A Gery Sabedra, PA-C  FLUoxetine (PROZAC) 40 MG capsule Take 40 mg by mouth daily.    Historical Provider, MD  HYDROcodone-acetaminophen (NORCO/VICODIN) 5-325 MG per tablet 1-2 tab po q6h prn 09/18/13   Rosalita Chessman, DO  ibuprofen (ADVIL,MOTRIN) 200 MG tablet Take 400 mg by mouth every 6 (six) hours as needed for pain.     Historical Provider, MD  nitrofurantoin, macrocrystal-monohydrate, (MACROBID) 100 MG capsule Take 1 capsule (100 mg total) by mouth 2 (two) times daily. X 7 days 11/05/13   Karen Chafe Molpus, MD   BP 91/58  Pulse 71  Temp(Src) 98.2 F (36.8 C) (Oral)  Resp 16  Ht 5\' 2"  (1.575 m)  Wt 125 lb (56.7 kg)  BMI 22.86  kg/m2  SpO2 99% Physical Exam  Constitutional: She is oriented to person, place, and time. She appears well-developed and well-nourished.  HENT:  Head: Normocephalic.  Neck: Normal range of motion. Neck supple.  Cardiovascular: Normal rate and regular rhythm.   Pulmonary/Chest: Effort normal and breath sounds normal.  Abdominal: Soft. Bowel sounds are normal. There is no tenderness. There is no rebound and no guarding.  Musculoskeletal: Normal range of motion.  Mild lumbar and paralumbar tenderness without swelling or discoloration. Bilateral sciatic tenderness.  Neurological: She is alert and oriented to person, place, and time. She has normal reflexes. Coordination normal.  Skin: Skin is warm and dry. No rash  noted.  Psychiatric: She has a normal mood and affect.    ED Course  Procedures (including critical care time) Labs Review Labs Reviewed - No data to display  Imaging Review No results found.   EKG Interpretation None      MDM   Final diagnoses:  None    1. Chronic low back pain  No neurologic deficits on exam or by history. She reports "usual" symptoms with periodic muscular pain flares. Treated with pain management. She reports she has usual medications at home.     Dewaine Oats, PA-C 08/02/14 1730

## 2014-08-02 NOTE — ED Notes (Addendum)
Pt c/o back pain x 2 days HX chronic back pain  Pt sees Pain management.

## 2014-08-02 NOTE — ED Provider Notes (Signed)
Medical screening examination/treatment/procedure(s) were performed by non-physician practitioner and as supervising physician I was immediately available for consultation/collaboration.   EKG Interpretation None       Orlie Dakin, MD 08/02/14 2318

## 2014-08-20 ENCOUNTER — Encounter (HOSPITAL_BASED_OUTPATIENT_CLINIC_OR_DEPARTMENT_OTHER): Payer: Self-pay | Admitting: Emergency Medicine

## 2014-08-20 ENCOUNTER — Emergency Department (HOSPITAL_BASED_OUTPATIENT_CLINIC_OR_DEPARTMENT_OTHER)
Admission: EM | Admit: 2014-08-20 | Discharge: 2014-08-20 | Disposition: A | Discharge status: Home / Self Care | Payer: Federal, State, Local not specified - PPO | Attending: Emergency Medicine | Admitting: Emergency Medicine

## 2014-08-20 DIAGNOSIS — M549 Dorsalgia, unspecified: Secondary | ICD-10-CM | POA: Diagnosis not present

## 2014-08-20 DIAGNOSIS — Z8709 Personal history of other diseases of the respiratory system: Secondary | ICD-10-CM | POA: Insufficient documentation

## 2014-08-20 DIAGNOSIS — F410 Panic disorder [episodic paroxysmal anxiety] without agoraphobia: Secondary | ICD-10-CM | POA: Insufficient documentation

## 2014-08-20 DIAGNOSIS — F17200 Nicotine dependence, unspecified, uncomplicated: Secondary | ICD-10-CM | POA: Diagnosis not present

## 2014-08-20 DIAGNOSIS — G8929 Other chronic pain: Secondary | ICD-10-CM

## 2014-08-20 DIAGNOSIS — Z79899 Other long term (current) drug therapy: Secondary | ICD-10-CM | POA: Diagnosis not present

## 2014-08-20 DIAGNOSIS — Z87828 Personal history of other (healed) physical injury and trauma: Secondary | ICD-10-CM | POA: Insufficient documentation

## 2014-08-20 MED ORDER — HYDROMORPHONE HCL PF 1 MG/ML IJ SOLN
1.0000 mg | Freq: Once | INTRAMUSCULAR | Status: AC
  Administered 2014-08-20: 1 mg via INTRAMUSCULAR
  Filled 2014-08-20: qty 1

## 2014-08-20 MED ORDER — METHYLPREDNISOLONE (PAK) 4 MG PO TABS: ORAL_TABLET | ORAL | Status: DC

## 2014-08-20 MED ORDER — KETOROLAC TROMETHAMINE 60 MG/2ML IM SOLN
60.0000 mg | Freq: Once | INTRAMUSCULAR | Status: AC
  Administered 2014-08-20: 60 mg via INTRAMUSCULAR
  Filled 2014-08-20: qty 2

## 2014-08-20 NOTE — ED Provider Notes (Signed)
CSN: [id number]     Arrival date & time 08/20/14  1330 History   First MD Initiated Contact with Patient 08/20/14 1351     Chief Complaint  Patient presents with  . Back Pain     (Consider location/radiation/quality/duration/timing/severity/associated sxs/prior Treatment) HPI  Patient with a history of chronic back pain presents with back pain. Patient reports worsening back pain over the last day. She's been taking her Norco at home without relief. She reports an old injury and feels like she may have aggravated it. She denies any radiation of the pain. Currently her pain is 8/10. She denies any weakness, numbness, or tingling of the lower Trinity's. She denies any difficulty with her bowel or bladder. This pain is similar to prior "exacerbations." She denies any recent fevers or drug use.  Past Medical History  Diagnosis Date  . Neck pain, chronic   . Chronic headaches   . Nerve damage   . Bronchitis   . Panic attacks   . Chronic back pain    Past Surgical History  Procedure Laterality Date  . Neck surgery  2002   Family History  Problem Relation Age of Onset  . Hypertension Father   . Lung cancer Mother   . Emphysema Mother     Smoker   History  Substance Use Topics  . Smoking status: Current Every Day Smoker -- 1.00 packs/day    Types: Cigarettes  . Smokeless tobacco: Never Used  . Alcohol Use: No   OB History   Grav Para Term Preterm Abortions TAB SAB Ect Mult Living                 Review of Systems  Genitourinary: Negative for difficulty urinating.  Musculoskeletal: Positive for back pain.  Neurological: Negative for weakness and numbness.  All other systems reviewed and are negative.     Allergies  Anaprox and Tramadol  Home Medications   Prior to Admission medications   Medication Sig Start Date End Date Taking? Authorizing Provider  ALPRAZolam (XANAX) 0.25 MG tablet Take 0.25 mg by mouth 3 (three) times daily as needed for anxiety.     Historical  Provider, MD  CLONAZEPAM PO Take by mouth.    Historical Provider, MD  cyclobenzaprine (FLEXERIL) 10 MG tablet Take 1 tablet (10 mg total) by mouth 3 (three) times daily as needed for muscle spasms. 09/30/13   Rosalita Chessman, DO  cyclobenzaprine (FLEXERIL) 10 MG tablet Take 1 tablet (10 mg total) by mouth 2 (two) times daily as needed for muscle spasms. 06/13/14   Shari A Upstill, PA-C  FLUoxetine (PROZAC) 40 MG capsule Take 40 mg by mouth daily.    Historical Provider, MD  HYDROcodone-acetaminophen (NORCO/VICODIN) 5-325 MG per tablet 1-2 tab po q6h prn 09/18/13   Rosalita Chessman, DO  ibuprofen (ADVIL,MOTRIN) 200 MG tablet Take 400 mg by mouth every 6 (six) hours as needed for pain.     Historical Provider, MD  methylPREDNIsolone (MEDROL DOSPACK) 4 MG tablet follow package directions 08/20/14   Merryl Hacker, MD  nitrofurantoin, macrocrystal-monohydrate, (MACROBID) 100 MG capsule Take 1 capsule (100 mg total) by mouth 2 (two) times daily. X 7 days 11/05/13   Karen Chafe Molpus, MD   BP 90/52  Pulse 90  Temp(Src) 98.2 F (36.8 C) (Oral)  Resp 14  Ht 5\' 2"  (1.575 m)  Wt 125 lb (56.7 kg)  BMI 22.86 kg/m2  SpO2 99% Physical Exam  Nursing note and vitals reviewed. Constitutional: She  is oriented to person, place, and time.  thin  HENT:  Head: Normocephalic and atraumatic.  Cardiovascular: Normal rate and regular rhythm.   Pulmonary/Chest: Effort normal and breath sounds normal. No respiratory distress.  Abdominal: Soft.  Musculoskeletal: She exhibits no edema.  No tenderness to palpation over the midline of the lumbar spine, bilateral paraspinous muscle tenderness, negative straight leg raise  Neurological: She is alert and oriented to person, place, and time.  5 out of 5 strength in the bilateral lower extremities in hip flexors, knee extensors, dorsi, and plantar flexion, normal and equal reflexes bilaterally  Skin: Skin is warm and dry.  Psychiatric: She has a normal mood and affect.     ED Course  Procedures (including critical care time) Labs Review Labs Reviewed - No data to display  Imaging Review No results found.   EKG Interpretation None      MDM   Final diagnoses:  Chronic back pain    Patient presents with acute on chronic back pain. Consistent with prior episodes. No red flags and patient is neurologically intact. Patient was given one dose of IM Toradol and Dilaudid with improvement of her pain. She will not be discharged with any narcotic pain medication at this time.  After history, exam, and medical workup I feel the patient has been appropriately medically screened and is safe for discharge home. Pertinent diagnoses were discussed with the patient. Patient was given return precautions.     Merryl Hacker, MD 08/20/14 [phone]

## 2014-08-20 NOTE — Discharge Instructions (Signed)

## 2014-08-20 NOTE — ED Notes (Signed)
Back pain since yesterday. Chronic pain. Norco doesn't help

## 2014-08-22 ENCOUNTER — Emergency Department (HOSPITAL_COMMUNITY)
Admission: EM | Admit: 2014-08-22 | Discharge: 2014-08-23 | Disposition: A | Discharge status: Home / Self Care | Attending: Emergency Medicine | Admitting: Emergency Medicine

## 2014-08-22 ENCOUNTER — Emergency Department (HOSPITAL_COMMUNITY)

## 2014-08-22 ENCOUNTER — Encounter (HOSPITAL_COMMUNITY): Payer: Self-pay | Admitting: Emergency Medicine

## 2014-08-22 DIAGNOSIS — F17200 Nicotine dependence, unspecified, uncomplicated: Secondary | ICD-10-CM | POA: Diagnosis not present

## 2014-08-22 DIAGNOSIS — Y929 Unspecified place or not applicable: Secondary | ICD-10-CM | POA: Diagnosis not present

## 2014-08-22 DIAGNOSIS — Y990 Civilian activity done for income or pay: Secondary | ICD-10-CM | POA: Insufficient documentation

## 2014-08-22 DIAGNOSIS — G8929 Other chronic pain: Secondary | ICD-10-CM | POA: Diagnosis not present

## 2014-08-22 DIAGNOSIS — F410 Panic disorder [episodic paroxysmal anxiety] without agoraphobia: Secondary | ICD-10-CM | POA: Insufficient documentation

## 2014-08-22 DIAGNOSIS — Z8669 Personal history of other diseases of the nervous system and sense organs: Secondary | ICD-10-CM | POA: Diagnosis not present

## 2014-08-22 DIAGNOSIS — S92911A Unspecified fracture of right toe(s), initial encounter for closed fracture: Secondary | ICD-10-CM

## 2014-08-22 DIAGNOSIS — S92919A Unspecified fracture of unspecified toe(s), initial encounter for closed fracture: Secondary | ICD-10-CM | POA: Diagnosis not present

## 2014-08-22 DIAGNOSIS — S99929A Unspecified injury of unspecified foot, initial encounter: Secondary | ICD-10-CM

## 2014-08-22 DIAGNOSIS — Y939 Activity, unspecified: Secondary | ICD-10-CM | POA: Insufficient documentation

## 2014-08-22 DIAGNOSIS — Z8709 Personal history of other diseases of the respiratory system: Secondary | ICD-10-CM | POA: Diagnosis not present

## 2014-08-22 DIAGNOSIS — Z79899 Other long term (current) drug therapy: Secondary | ICD-10-CM | POA: Insufficient documentation

## 2014-08-22 DIAGNOSIS — S99919A Unspecified injury of unspecified ankle, initial encounter: Secondary | ICD-10-CM

## 2014-08-22 DIAGNOSIS — IMO0002 Reserved for concepts with insufficient information to code with codable children: Secondary | ICD-10-CM | POA: Insufficient documentation

## 2014-08-22 DIAGNOSIS — S8990XA Unspecified injury of unspecified lower leg, initial encounter: Secondary | ICD-10-CM | POA: Insufficient documentation

## 2014-08-22 MED ORDER — IBUPROFEN 800 MG PO TABS
800.0000 mg | ORAL_TABLET | Freq: Once | ORAL | Status: AC
  Administered 2014-08-22: 800 mg via ORAL
  Filled 2014-08-22: qty 1

## 2014-08-22 NOTE — ED Provider Notes (Signed)
CSN: [id number]     Arrival date & time 08/22/14  2231 History  This chart was scribed for Angela Sams, PA-C working with Threasa Beards, MD by Randa Evens, ED Scribe. This patient was seen in room WTR8/WTR8 and the patient's care was started at 11:17 PM.    Chief Complaint  Patient presents with  . Foot Injury   Patient is a 53 y.o. female presenting with foot injury. The history is provided by the patient. No language interpreter was used.  Foot Injury  HPI Comments: Angela Pineda is a 53 y.o. female who presents to the Emergency Department complaining of right foot injury onset 1 day prior. She describes the injury as painful. She states that her second right toe has some bruising and mild deformity. She states that piece of heavy equipment rolled across her foot. She states she was wearing tennis shoes when the injury occurred. She states she has been applying ice and taking ibuprofen with no relief. She denies numbness.   Past Medical History  Diagnosis Date  . Neck pain, chronic   . Chronic headaches   . Nerve damage   . Bronchitis   . Panic attacks   . Chronic back pain    Past Surgical History  Procedure Laterality Date  . Neck surgery  2002   Family History  Problem Relation Age of Onset  . Hypertension Father   . Lung cancer Mother   . Emphysema Mother     Smoker   History  Substance Use Topics  . Smoking status: Current Every Day Smoker -- 1.00 packs/day    Types: Cigarettes  . Smokeless tobacco: Never Used  . Alcohol Use: No   OB History   Grav Para Term Preterm Abortions TAB SAB Ect Mult Living                 Review of Systems  Musculoskeletal: Positive for arthralgias.  Neurological: Negative for numbness.   Allergies  Anaprox and Tramadol  Home Medications   Prior to Admission medications   Medication Sig Start Date End Date Taking? Authorizing Provider  clonazePAM (KLONOPIN) 0.5 MG tablet Take 0.5 mg by mouth 2 (two) times daily as  needed for anxiety.   Yes Historical Provider, MD  FLUoxetine (PROZAC) 40 MG capsule Take 40 mg by mouth daily.    Historical Provider, MD  HYDROcodone-acetaminophen (NORCO/VICODIN) 5-325 MG per tablet 1-2 tab po q6h prn 09/18/13   Rosalita Chessman, DO  ibuprofen (ADVIL,MOTRIN) 200 MG tablet Take 400 mg by mouth every 6 (six) hours as needed for pain.     Historical Provider, MD  methylPREDNIsolone (MEDROL DOSPACK) 4 MG tablet follow package directions 08/20/14   Merryl Hacker, MD  nitrofurantoin, macrocrystal-monohydrate, (MACROBID) 100 MG capsule Take 1 capsule (100 mg total) by mouth 2 (two) times daily. X 7 days 11/05/13   Wynetta Fines, MD   Triage Vitals: BP 93/42  Pulse 72  Temp(Src) 98.3 F (36.8 C) (Oral)  Resp 16  Ht 5\' 2"  (1.575 m)  Wt 125 lb (56.7 kg)  BMI 22.86 kg/m2  SpO2 97%  Physical Exam  Nursing note and vitals reviewed. Constitutional: She is oriented to person, place, and time. She appears well-developed and well-nourished. No distress.  HENT:  Head: Normocephalic and atraumatic.  Eyes: Conjunctivae and EOM are normal.  Neck: Neck supple. No tracheal deviation present.  Cardiovascular: Normal rate.   Pulmonary/Chest: Effort normal. No respiratory distress.  Musculoskeletal: Normal range of  motion.  There is slight deformity, swelling and bruising to the right second toe. Normal capillary refill less than 2 seconds. Normal sensations. Reduced range of motion secondary to deformity and pain.  Neurological: She is alert and oriented to person, place, and time.  Skin: Skin is warm and dry.  Psychiatric: She has a normal mood and affect. Her behavior is normal.    ED Course  Procedures   DIAGNOSTIC STUDIES: Oxygen Saturation is 97% on RA, normal by my interpretation.    COORDINATION OF CARE: 11:23 PM-Discussed treatment plan which includes right foot x-ray with pt at bedside and pt agreed to plan.   X-rays reviewed and discussed with the patient. We'll use  buddy taping of toes and postop shoe. Patient given orthopedic referral. Agrees with plan.  Imaging Review Dg Foot Complete Right  08/23/2014   CLINICAL DATA:  Right foot pain  EXAM: RIGHT FOOT COMPLETE - 3+ VIEW  COMPARISON:  None.  FINDINGS: Minimally displaced/angulated fracture involving the proximal aspect of the second proximal phalanx.  The joint spaces are preserved.  Mild dorsal soft tissue swelling along the forefoot.  IMPRESSION: Fracture involving the proximal aspect of the second proximal phalanx.   Electronically Signed   By: Julian Hy M.D.   On: 08/23/2014 00:20     MDM   Final diagnoses:  Fracture, toe, right, closed, initial encounter   I personally performed the services described in this documentation, which was scribed in my presence. The recorded information has been reviewed and is accurate.    Martie Frizell, PA-C 08/23/14 [phone]

## 2014-08-22 NOTE — ED Notes (Signed)
Pt states she works for the [employer] and yesterday at work a piece of equipment ran over her right foot  Pt states the pain is worse today and her employer wanted her to come have it checked out  Pt's second toe is bruised and deformity is noted

## 2014-08-23 MED ORDER — HYDROCODONE-ACETAMINOPHEN 5-325 MG PO TABS: 1.0000 | ORAL_TABLET | ORAL | Status: AC | PRN

## 2014-08-23 NOTE — ED Provider Notes (Signed)
Medical screening examination/treatment/procedure(s) were performed by non-physician practitioner and as supervising physician I was immediately available for consultation/collaboration.   EKG Interpretation None       Threasa Beards, MD 08/23/14 [id number]

## 2014-08-23 NOTE — Discharge Instructions (Signed)
You were found to have a fracture of your toe. Use Rest, Ice, Compression, and Elevation to reduce pain and swelling. Follow up with your doctor and an orthopedic specialist for continued evaluation and treatment.    Toe Fracture Your caregiver has diagnosed you as having a fractured toe. A toe fracture is a break in the bone of a toe. "Buddy taping" is a way of splinting your broken toe, by taping the broken toe to the toe next to it. This "buddy taping" will keep the injured toe from moving beyond normal range of motion. Buddy taping also helps the toe heal in a more normal alignment. It may take 6 to 8 weeks for the toe injury to heal. [place] your toes taped together for as long as directed by your caregiver or until you see a doctor for a follow-up examination. You can change the tape after bathing. Always use a small piece of gauze or cotton between the toes when taping them together. This will help the skin stay dry and prevent infection.  Apply ice to the injury for 15-20 minutes each hour while awake for the first 2 days. Put the ice in a plastic bag and place a towel between the bag of ice and your skin.  After the first 2 days, apply heat to the injured area. Use heat for the next 2 to 3 days. Place a heating pad on the foot or soak the foot in warm water as directed by your caregiver.  Keep your foot elevated as much as possible to lessen swelling.  Wear sturdy, supportive shoes. The shoes should not pinch the toes or fit tightly against the toes.  Your caregiver may prescribe a rigid shoe if your foot is very swollen.  Your may be given crutches if the pain is too great and it hurts too much to walk.  Only take over-the-counter or prescription medicines for pain, discomfort, or fever as directed by your caregiver.  If your caregiver has given you a follow-up appointment, it is very important to keep that appointment. Not keeping the appointment could result  in a chronic or permanent injury, pain, and disability. If there is any problem keeping the appointment, you must call back to this facility for assistance. SEEK MEDICAL CARE IF:   You have increased pain or swelling, not relieved with medications.  The pain does not get better after 1 week.  Your injured toe is cold when the others are warm. SEEK IMMEDIATE MEDICAL CARE IF:   The toe becomes cold, numb, or white.  The toe becomes hot (inflamed) and red. Document Released: 11/30/2000 Document Revised: 02/25/2012 Document Reviewed: 07/19/2008 [facility] Patient Information 2015 [place], [place]. This information is not intended to replace advice given to you by your health care provider. Make sure you discuss any questions you have with your health care provider.

## 2014-08-24 ENCOUNTER — Emergency Department (HOSPITAL_BASED_OUTPATIENT_CLINIC_OR_DEPARTMENT_OTHER)
Admission: EM | Admit: 2014-08-24 | Discharge: 2014-08-24 | Disposition: A | Discharge status: Home / Self Care | Attending: Emergency Medicine | Admitting: Emergency Medicine

## 2014-08-24 ENCOUNTER — Encounter (HOSPITAL_BASED_OUTPATIENT_CLINIC_OR_DEPARTMENT_OTHER): Payer: Self-pay | Admitting: Emergency Medicine

## 2014-08-24 DIAGNOSIS — Z8709 Personal history of other diseases of the respiratory system: Secondary | ICD-10-CM | POA: Diagnosis not present

## 2014-08-24 DIAGNOSIS — Z79899 Other long term (current) drug therapy: Secondary | ICD-10-CM | POA: Diagnosis not present

## 2014-08-24 DIAGNOSIS — F17200 Nicotine dependence, unspecified, uncomplicated: Secondary | ICD-10-CM | POA: Diagnosis not present

## 2014-08-24 DIAGNOSIS — IMO0001 Reserved for inherently not codable concepts without codable children: Secondary | ICD-10-CM | POA: Diagnosis not present

## 2014-08-24 DIAGNOSIS — G8929 Other chronic pain: Secondary | ICD-10-CM | POA: Insufficient documentation

## 2014-08-24 DIAGNOSIS — M79609 Pain in unspecified limb: Secondary | ICD-10-CM | POA: Diagnosis present

## 2014-08-24 DIAGNOSIS — S92911S Unspecified fracture of right toe(s), sequela: Secondary | ICD-10-CM

## 2014-08-24 DIAGNOSIS — F410 Panic disorder [episodic paroxysmal anxiety] without agoraphobia: Secondary | ICD-10-CM | POA: Insufficient documentation

## 2014-08-24 NOTE — ED Notes (Signed)
WC injury to right 2nd toe 3 days ago.  Was diagnosed with fx at [facility] 2 days ago - per pt.  Sts the pain is just getting worse.

## 2014-08-24 NOTE — ED Provider Notes (Signed)
CSN: [id number]     Arrival date & time 08/24/14  1718 History   First MD Initiated Contact with Patient 08/24/14 1739     Chief Complaint  Patient presents with  . Toe Pain     (Consider location/radiation/quality/duration/timing/severity/associated sxs/prior Treatment) HPI Comments: Patient is a 53 year old female with a past medical history of chronic neck and back pain, chronic headaches, bronchitis and panic attacks who presents to the emergency department complaining of continued right second toe pain x3 days. Patient was seen in the emergency department 2 days ago and diagnosed with a fracture of her right second toe. She was discharged with 20 Vicodin. States she has not followed up with the orthopedist because the workers comp has not been processed yet. States the Vicodin is not helping at all. No new injury or trauma. She is requesting a shot for pain.  Patient is a 53 y.o. female presenting with toe pain. The history is provided by the patient.  Toe Pain    Past Medical History  Diagnosis Date  . Neck pain, chronic   . Chronic headaches   . Nerve damage   . Bronchitis   . Panic attacks   . Chronic back pain    Past Surgical History  Procedure Laterality Date  . Neck surgery  2002  . Kidney surgery     Family History  Problem Relation Age of Onset  . Hypertension Father   . Lung cancer Mother   . Emphysema Mother     Smoker   History  Substance Use Topics  . Smoking status: Current Every Day Smoker -- 1.00 packs/day    Types: Cigarettes  . Smokeless tobacco: Never Used  . Alcohol Use: No   OB History   Grav Para Term Preterm Abortions TAB SAB Ect Mult Living                 Review of Systems  Constitutional: Negative.   HENT: Negative.   Cardiovascular: Negative.   Musculoskeletal:       + right second toe pain.  Neurological: Negative.       Allergies  Anaprox and Tramadol  Home Medications   Prior to Admission medications   Medication Sig  Start Date End Date Taking? Authorizing Provider  clonazePAM (KLONOPIN) 0.5 MG tablet Take 0.5 mg by mouth 2 (two) times daily as needed for anxiety.    Historical Provider, MD  FLUoxetine (PROZAC) 40 MG capsule Take 40 mg by mouth daily.    Historical Provider, MD  HYDROcodone-acetaminophen (NORCO/VICODIN) 5-325 MG per tablet Take 1-2 tablets by mouth every 4 (four) hours as needed for moderate pain. 08/23/14   Ruthell Rummage Dammen, PA-C   BP 98/70  Pulse 86  Temp(Src) 98.6 F (37 C) (Oral)  Resp 16  Ht 5\' 2"  (1.575 m)  Wt 130 lb (58.968 kg)  BMI 23.77 kg/m2  SpO2 98% Physical Exam  Nursing note and vitals reviewed. Constitutional: She is oriented to person, place, and time. She appears well-developed and well-nourished. No distress.  HENT:  Head: Normocephalic and atraumatic.  Mouth/Throat: Oropharynx is clear and moist.  Eyes: Conjunctivae and EOM are normal.  Neck: Normal range of motion. Neck supple.  Cardiovascular: Normal rate and intact distal pulses.   Pulmonary/Chest: Effort normal. No respiratory distress.  Musculoskeletal:  Bruising and swelling noted to right second toe. Range of motion limited by pain. Cap refill < 3 seconds.  Neurological: She is alert and oriented to person, place, and time.  No sensory deficit.  Sensation intact.  Skin: Skin is warm and dry.  Psychiatric: She has a normal mood and affect. Her behavior is normal.    ED Course  Procedures (including critical care time) Labs Review Labs Reviewed - No data to display  Imaging Review Dg Foot Complete Right  08/23/2014   CLINICAL DATA:  Right foot pain  EXAM: RIGHT FOOT COMPLETE - 3+ VIEW  COMPARISON:  None.  FINDINGS: Minimally displaced/angulated fracture involving the proximal aspect of the second proximal phalanx.  The joint spaces are preserved.  Mild dorsal soft tissue swelling along the forefoot.  IMPRESSION: Fracture involving the proximal aspect of the second proximal phalanx.   Electronically Signed    By: Julian Hy M.D.   On: 08/23/2014 00:20     EKG Interpretation None      MDM   Final diagnoses:  Toe fracture, right, sequela   I discussed the patient can continue taking Norco. I do not feel a shot for pain is appropriate at this time. She has a postop shoe. Stable for discharge. Return precautions given. Patient states understanding of treatment care plan and is agreeable.   Illene Labrador, PA-C 08/24/14 [id number]

## 2014-08-24 NOTE — Discharge Instructions (Signed)

## 2014-08-26 NOTE — ED Provider Notes (Signed)
Medical screening examination/treatment/procedure(s) were performed by non-physician practitioner and as supervising physician I was immediately available for consultation/collaboration.   EKG Interpretation None        Debby Freiberg, MD 08/26/14 1235

## 2014-09-22 ENCOUNTER — Telehealth: Payer: Self-pay | Admitting: *Deleted

## 2014-09-22 NOTE — Telephone Encounter (Signed)
Received medical records release request from via fax from [place]. Form forwarded to [facility]. JG//CMA

## 2014-11-18 ENCOUNTER — Other Ambulatory Visit: Payer: Self-pay | Admitting: Family Medicine

## 2014-11-18 DIAGNOSIS — Z23 Encounter for immunization: Secondary | ICD-10-CM

## 2014-11-18 MED ORDER — ZOSTER VACCINE LIVE 19400 UNT/0.65ML ~~LOC~~ SOLR: 0.6500 mL | Freq: Once | SUBCUTANEOUS | Status: DC

## 2015-05-14 ENCOUNTER — Emergency Department (HOSPITAL_BASED_OUTPATIENT_CLINIC_OR_DEPARTMENT_OTHER)
Admission: EM | Admit: 2015-05-14 | Discharge: 2015-05-14 | Disposition: A | Discharge status: Home / Self Care | Payer: Federal, State, Local not specified - PPO | Attending: Emergency Medicine | Admitting: Emergency Medicine

## 2015-05-14 ENCOUNTER — Encounter (HOSPITAL_BASED_OUTPATIENT_CLINIC_OR_DEPARTMENT_OTHER): Payer: Self-pay

## 2015-05-14 DIAGNOSIS — R0981 Nasal congestion: Secondary | ICD-10-CM | POA: Insufficient documentation

## 2015-05-14 DIAGNOSIS — G8929 Other chronic pain: Secondary | ICD-10-CM | POA: Diagnosis not present

## 2015-05-14 DIAGNOSIS — Z8709 Personal history of other diseases of the respiratory system: Secondary | ICD-10-CM | POA: Insufficient documentation

## 2015-05-14 DIAGNOSIS — R51 Headache: Secondary | ICD-10-CM | POA: Diagnosis not present

## 2015-05-14 DIAGNOSIS — Z720 Tobacco use: Secondary | ICD-10-CM | POA: Diagnosis not present

## 2015-05-14 DIAGNOSIS — H9202 Otalgia, left ear: Secondary | ICD-10-CM | POA: Diagnosis not present

## 2015-05-14 DIAGNOSIS — Z8669 Personal history of other diseases of the nervous system and sense organs: Secondary | ICD-10-CM | POA: Diagnosis not present

## 2015-05-14 DIAGNOSIS — Z79899 Other long term (current) drug therapy: Secondary | ICD-10-CM | POA: Diagnosis not present

## 2015-05-14 DIAGNOSIS — F410 Panic disorder [episodic paroxysmal anxiety] without agoraphobia: Secondary | ICD-10-CM | POA: Insufficient documentation

## 2015-05-14 DIAGNOSIS — H6592 Unspecified nonsuppurative otitis media, left ear: Secondary | ICD-10-CM | POA: Insufficient documentation

## 2015-05-14 MED ORDER — FLUTICASONE PROPIONATE 50 MCG/ACT NA SUSP: NASAL | Status: DC

## 2015-05-14 MED ORDER — CETIRIZINE HCL 10 MG PO CAPS: 1.0000 | ORAL_CAPSULE | Freq: Every day | ORAL | Status: DC

## 2015-05-14 NOTE — ED Notes (Signed)
MD at bedside. 

## 2015-05-14 NOTE — ED Provider Notes (Signed)
CSN: [id number]     Arrival date & time 05/14/15  1025 History   First MD Initiated Contact with Patient 05/14/15 1023     Chief Complaint  Patient presents with  . Otalgia      HPI  She presents for evaluation of left ear pain for a month. States it feels like there is pressure and sharp pain. "Sometimes it gives me migraines".  Past Medical History  Diagnosis Date  . Neck pain, chronic   . Chronic headaches   . Nerve damage   . Bronchitis   . Panic attacks   . Chronic back pain    Past Surgical History  Procedure Laterality Date  . Neck surgery  2002  . Kidney surgery     Family History  Problem Relation Age of Onset  . Hypertension Father   . Lung cancer Mother   . Emphysema Mother     Smoker   History  Substance Use Topics  . Smoking status: Current Every Day Smoker -- 1.00 packs/day    Types: Cigarettes  . Smokeless tobacco: Never Used  . Alcohol Use: No   OB History    No data available     Review of Systems  Constitutional: Negative for fever, chills, diaphoresis, appetite change and fatigue.  HENT: Positive for congestion and ear pain. Negative for ear discharge, mouth sores, sore throat and trouble swallowing.   Eyes: Negative for visual disturbance.  Respiratory: Negative for cough, chest tightness, shortness of breath and wheezing.   Cardiovascular: Negative for chest pain.  Gastrointestinal: Negative for nausea, vomiting, abdominal pain, diarrhea and abdominal distention.  Endocrine: Negative for polydipsia, polyphagia and polyuria.  Genitourinary: Negative for dysuria, frequency and hematuria.  Musculoskeletal: Negative for gait problem.  Skin: Negative for color change, pallor and rash.  Neurological: Positive for headaches. Negative for dizziness, syncope and light-headedness.  Hematological: Does not bruise/bleed easily.  Psychiatric/Behavioral: Negative for behavioral problems and confusion.      Allergies  Anaprox and Tramadol  Home  Medications   Prior to Admission medications   Medication Sig Start Date End Date Taking? Authorizing Provider  Cetirizine HCl (ZYRTEC ALLERGY) 10 MG CAPS Take 1 capsule (10 mg total) by mouth daily. 05/14/15   Tanna Furry, MD  clonazePAM (KLONOPIN) 0.5 MG tablet Take 0.5 mg by mouth 2 (two) times daily as needed for anxiety.    Historical Provider, MD  FLUoxetine (PROZAC) 40 MG capsule Take 40 mg by mouth daily.    Historical Provider, MD  fluticasone Asencion Islam) 50 MCG/ACT nasal spray 1 spray each nares twice a day 05/14/15   Tanna Furry, MD  HYDROcodone-acetaminophen (NORCO/VICODIN) 5-325 MG per tablet Take 1-2 tablets by mouth every 4 (four) hours as needed for moderate pain. 08/23/14   Hazel Sams, PA-C  zoster vaccine live, PF, (ZOSTAVAX) 81856 UNT/0.65ML injection Inject 19,400 Units into the skin once. 11/18/14   Yvonne R Lowne, DO   BP 105/72 mmHg  Pulse 80  Temp(Src) 98.4 F (36.9 C) (Oral)  Resp 21  Ht 5\' 2"  (1.575 m)  Wt 130 lb (58.968 kg)  BMI 23.77 kg/m2  SpO2 95% Physical Exam  Constitutional: She is oriented to person, place, and time. She appears well-developed and well-nourished. No distress.  HENT:  Head: Normocephalic.  Ears:  Eyes: Conjunctivae are normal. Pupils are equal, round, and reactive to light. No scleral icterus.  Neck: Normal range of motion. Neck supple. No thyromegaly present.  Cardiovascular: Normal rate and regular rhythm.  Exam  reveals no gallop and no friction rub.   No murmur heard. Pulmonary/Chest: Effort normal and breath sounds normal. No respiratory distress. She has no wheezes. She has no rales.  Abdominal: Soft. Bowel sounds are normal. She exhibits no distension. There is no tenderness. There is no rebound.  Musculoskeletal: Normal range of motion.  Neurological: She is alert and oriented to person, place, and time.  Skin: Skin is warm and dry. No rash noted.  Psychiatric: She has a normal mood and affect. Her behavior is normal.    ED  Course  Procedures (including critical care time) Labs Review Labs Reviewed - No data to display  Imaging Review No results found.   EKG Interpretation None      MDM   Final diagnoses:  Otalgia, left    Cerumen in the ear, but not impacted. Eardrum does not appear infected. There is serous otitis the ear. We discussed Sudafed, Zyrtec, Flonase spray.   Tanna Furry, MD 05/14/15 1056

## 2015-05-14 NOTE — Discharge Instructions (Signed)
Your ear does not appear infected. You have fluid behind your ear. Take Sudafed over-the-counter, morning and evening. Action for Zyrtec and an histamine, and Flonase nasal steroid spray. When your ear drains to your throat, the pressure in your ear will be relieved.  This may take several days of medications.   Otalgia The most common reason for this in children is an infection of the middle ear. Pain from the middle ear is usually caused by a build-up of fluid and pressure behind the eardrum. Pain from an earache can be sharp, dull, or burning. The pain may be temporary or constant. The middle ear is connected to the nasal passages by a short narrow tube called the Eustachian tube. The Eustachian tube allows fluid to drain out of the middle ear, and helps keep the pressure in your ear equalized. CAUSES  A cold or allergy can block the Eustachian tube with inflammation and the build-up of secretions. This is especially likely in small children, because their Eustachian tube is shorter and more horizontal. When the Eustachian tube closes, the normal flow of fluid from the middle ear is stopped. Fluid can accumulate and cause stuffiness, pain, hearing loss, and an ear infection if germs start growing in this area. SYMPTOMS  The symptoms of an ear infection may include fever, ear pain, fussiness, increased crying, and irritability. Many children will have temporary and minor hearing loss during and right after an ear infection. Permanent hearing loss is rare, but the risk increases the more infections a child has. Other causes of ear pain include retained water in the outer ear canal from swimming and bathing. Ear pain in adults is less likely to be from an ear infection. Ear pain may be referred from other locations. Referred pain may be from the joint between your jaw and the skull. It may also come from a tooth problem or problems in the neck. Other causes of ear pain include:  A foreign body in the  ear.  Outer ear infection.  Sinus infections.  Impacted ear wax.  Ear injury.  Arthritis of the jaw or TMJ problems.  Middle ear infection.  Tooth infections.  Sore throat with pain to the ears. DIAGNOSIS  Your caregiver can usually make the diagnosis by examining you. Sometimes other special studies, including x-rays and lab work may be necessary. TREATMENT   If antibiotics were prescribed, use them as directed and finish them even if you or your child's symptoms seem to be improved.  Sometimes PE tubes are needed in children. These are little plastic tubes which are put into the eardrum during a simple surgical procedure. They allow fluid to drain easier and allow the pressure in the middle ear to equalize. This helps relieve the ear pain caused by pressure changes. HOME CARE INSTRUCTIONS   Only take over-the-counter or prescription medicines for pain, discomfort, or fever as directed by your caregiver. DO NOT GIVE CHILDREN ASPIRIN because of the association of Reye's Syndrome in children taking aspirin.  Use a cold pack applied to the outer ear for 15-20 minutes, 03-04 times per day or as needed may reduce pain. Do not apply ice directly to the skin. You may cause frost bite.  Over-the-counter ear drops used as directed may be effective. Your caregiver may sometimes prescribe ear drops.  Resting in an upright position may help reduce pressure in the middle ear and relieve pain.  Ear pain caused by rapidly descending from high altitudes can be relieved by swallowing or  chewing gum. Allowing infants to suck on a bottle during airplane travel can help.  Do not smoke in the house or near children. If you are unable to quit smoking, smoke outside.  Control allergies. SEEK IMMEDIATE MEDICAL CARE IF:   You or your child are becoming sicker.  Pain or fever relief is not obtained with medicine.  You or your child's symptoms (pain, fever, or irritability) do not improve within  24 to 48 hours or as instructed.  Severe pain suddenly stops hurting. This may indicate a ruptured eardrum.  You or your children develop new problems such as severe headaches, stiff neck, difficulty swallowing, or swelling of the face or around the ear. Document Released: 07/20/2004 Document Revised: 02/25/2012 Document Reviewed: 11/24/2008 [facility] Patient Information 2015 [place], [place]. This information is not intended to replace advice given to you by your health care provider. Make sure you discuss any questions you have with your health care provider.

## 2015-05-14 NOTE — ED Notes (Addendum)
Pt reports 1 month of left ear pain.

## 2016-01-09 ENCOUNTER — Encounter (HOSPITAL_BASED_OUTPATIENT_CLINIC_OR_DEPARTMENT_OTHER): Payer: Self-pay

## 2016-01-09 ENCOUNTER — Emergency Department (HOSPITAL_BASED_OUTPATIENT_CLINIC_OR_DEPARTMENT_OTHER)
Admission: EM | Admit: 2016-01-09 | Discharge: 2016-01-09 | Disposition: A | Discharge status: Home / Self Care | Payer: Federal, State, Local not specified - PPO | Attending: Emergency Medicine | Admitting: Emergency Medicine

## 2016-01-09 DIAGNOSIS — R6883 Chills (without fever): Secondary | ICD-10-CM | POA: Insufficient documentation

## 2016-01-09 DIAGNOSIS — F410 Panic disorder [episodic paroxysmal anxiety] without agoraphobia: Secondary | ICD-10-CM | POA: Insufficient documentation

## 2016-01-09 DIAGNOSIS — R197 Diarrhea, unspecified: Secondary | ICD-10-CM | POA: Insufficient documentation

## 2016-01-09 DIAGNOSIS — F17210 Nicotine dependence, cigarettes, uncomplicated: Secondary | ICD-10-CM | POA: Insufficient documentation

## 2016-01-09 DIAGNOSIS — Z79899 Other long term (current) drug therapy: Secondary | ICD-10-CM | POA: Insufficient documentation

## 2016-01-09 DIAGNOSIS — R1110 Vomiting, unspecified: Secondary | ICD-10-CM

## 2016-01-09 DIAGNOSIS — Z8709 Personal history of other diseases of the respiratory system: Secondary | ICD-10-CM | POA: Diagnosis not present

## 2016-01-09 DIAGNOSIS — R112 Nausea with vomiting, unspecified: Secondary | ICD-10-CM | POA: Diagnosis not present

## 2016-01-09 DIAGNOSIS — G8929 Other chronic pain: Secondary | ICD-10-CM | POA: Diagnosis not present

## 2016-01-09 DIAGNOSIS — R1030 Lower abdominal pain, unspecified: Secondary | ICD-10-CM | POA: Diagnosis present

## 2016-01-09 DIAGNOSIS — R1084 Generalized abdominal pain: Secondary | ICD-10-CM | POA: Insufficient documentation

## 2016-01-09 LAB — CBC
HCT: 35.2 % — ABNORMAL LOW (ref 36.0–46.0)
Hemoglobin: 11.8 g/dL — ABNORMAL LOW (ref 12.0–15.0)
MCH: 33.5 pg (ref 26.0–34.0)
MCHC: 33.5 g/dL (ref 30.0–36.0)
MCV: 100 fL (ref 78.0–100.0)
PLATELETS: 205 10*3/uL (ref 150–400)
RBC: 3.52 MIL/uL — ABNORMAL LOW (ref 3.87–5.11)
RDW: 13.7 % (ref 11.5–15.5)
WBC: 5.3 10*3/uL (ref 4.0–10.5)

## 2016-01-09 LAB — COMPREHENSIVE METABOLIC PANEL
ALBUMIN: 4.1 g/dL (ref 3.5–5.0)
ALT: 14 U/L (ref 14–54)
AST: 21 U/L (ref 15–41)
Alkaline Phosphatase: 55 U/L (ref 38–126)
Anion gap: 6 (ref 5–15)
BUN: 11 mg/dL (ref 6–20)
CHLORIDE: 112 mmol/L — AB (ref 101–111)
CO2: 22 mmol/L (ref 22–32)
Calcium: 9.3 mg/dL (ref 8.9–10.3)
Creatinine, Ser: 0.81 mg/dL (ref 0.44–1.00)
GFR calc Af Amer: >60 mL/min (ref >60)
GFR calc non Af Amer: >60 mL/min (ref >60)
GLUCOSE: 80 mg/dL (ref 65–99)
POTASSIUM: 3.5 mmol/L (ref 3.5–5.1)
SODIUM: 140 mmol/L (ref 135–145)
TOTAL PROTEIN: 6.1 g/dL — AB (ref 6.5–8.1)
Total Bilirubin: 0.4 mg/dL (ref 0.3–1.2)

## 2016-01-09 LAB — URINALYSIS, ROUTINE W REFLEX MICROSCOPIC
Bilirubin Urine: NEGATIVE
Glucose, UA: NEGATIVE mg/dL
Ketones, ur: NEGATIVE mg/dL
Nitrite: NEGATIVE
PH: 6.5 (ref 5.0–8.0)
PROTEIN: NEGATIVE mg/dL
Specific Gravity, Urine: 1.018 (ref 1.005–1.030)

## 2016-01-09 LAB — URINE MICROSCOPIC-ADD ON

## 2016-01-09 LAB — LIPASE, BLOOD: Lipase: 26 U/L (ref 11–51)

## 2016-01-09 MED ORDER — DICYCLOMINE HCL 20 MG PO TABS: 20.0000 mg | ORAL_TABLET | Freq: Two times a day (BID) | ORAL | Status: DC

## 2016-01-09 MED ORDER — SODIUM CHLORIDE 0.9 % IV BOLUS (SEPSIS)
1000.0000 mL | Freq: Once | INTRAVENOUS | Status: AC
  Administered 2016-01-09: 1000 mL via INTRAVENOUS

## 2016-01-09 MED ORDER — ONDANSETRON HCL 4 MG/2ML IJ SOLN
4.0000 mg | Freq: Once | INTRAMUSCULAR | Status: AC
  Administered 2016-01-09: 4 mg via INTRAVENOUS
  Filled 2016-01-09: qty 2

## 2016-01-09 MED ORDER — SODIUM CHLORIDE 0.9 % IV BOLUS (SEPSIS)
1000.0000 mL | Freq: Once | INTRAVENOUS | Status: AC
Start: 2016-01-09 — End: 2016-01-09
  Administered 2016-01-09: 1000 mL via INTRAVENOUS

## 2016-01-09 MED ORDER — ONDANSETRON HCL 4 MG PO TABS: 4.0000 mg | ORAL_TABLET | Freq: Four times a day (QID) | ORAL | Status: DC

## 2016-01-09 MED ORDER — MORPHINE SULFATE (PF) 4 MG/ML IV SOLN
4.0000 mg | Freq: Once | INTRAVENOUS | Status: AC
  Administered 2016-01-09: 4 mg via INTRAVENOUS
  Filled 2016-01-09: qty 1

## 2016-01-09 NOTE — ED Provider Notes (Signed)
CSN: [id number]     Arrival date & time 01/09/16  1550 History  By signing my name below, I, Hilda Lias, attest that this documentation has been prepared under the direction and in the presence of Alfonzo Beers, MD. Electronically Signed: Hilda Lias, ED Scribe. 01/09/2016. 4:46 PM.    Chief Complaint  Patient presents with  . Abdominal Pain      Patient is a 55 y.o. female presenting with abdominal pain. The history is provided by the patient. No language interpreter was used.  Abdominal Pain Pain location:  Periumbilical and suprapubic Pain radiates to:  Does not radiate Pain severity:  Moderate Onset quality:  Gradual Duration:  6 days Timing:  Constant Progression:  Worsening Chronicity:  New Context: eating   Relieved by:  Nothing Worsened by:  Eating Ineffective treatments:  None tried Associated symptoms: chills, diarrhea, nausea and vomiting   Associated symptoms: no hematochezia and no melena    HPI Comments: Angela Pineda is a 55 y.o. female who presents to the Emergency Department complaining of constant, worsening lower abdominal pain that has been present for 5-6 days. Pt reports a week ago that she began having diarrhea, and states she has had several episodes of diarrhea per day since then. Pt states she has no relief of her abdominal pain after having a bowel movement, and reports that she has diarrhea immediately after she eats and drinks. Pt also reports having intermittent episodes of vomiting a few times per day, and states she has had intermittent chills as well. Pt denies ever having these symptoms for this long before. Pt denies fever, blood in her stool.    Past Medical History  Diagnosis Date  . Neck pain, chronic   . Chronic headaches   . Nerve damage   . Bronchitis   . Panic attacks   . Chronic back pain    Past Surgical History  Procedure Laterality Date  . Neck surgery  2002  . Kidney surgery    . Ablation on endometriosis     Family  History  Problem Relation Age of Onset  . Hypertension Father   . Lung cancer Mother   . Emphysema Mother     Smoker   Social History  Substance Use Topics  . Smoking status: Current Every Day Smoker -- 1.00 packs/day    Types: Cigarettes  . Smokeless tobacco: None  . Alcohol Use: No   OB History    No data available     Review of Systems  Constitutional: Positive for chills.  Gastrointestinal: Positive for nausea, vomiting, abdominal pain and diarrhea. Negative for melena and hematochezia.    A complete 10 system review of systems was obtained and all systems are negative except as noted in the HPI and PMH.    Allergies  Anaprox and Tramadol  Home Medications   Prior to Admission medications   Medication Sig Start Date End Date Taking? Authorizing Provider  clonazePAM (KLONOPIN) 0.5 MG tablet Take 0.5 mg by mouth 2 (two) times daily as needed for anxiety.    Historical Provider, MD  dicyclomine (BENTYL) 20 MG tablet Take 1 tablet (20 mg total) by mouth 2 (two) times daily. 01/09/16   Alfonzo Beers, MD  FLUoxetine (PROZAC) 40 MG capsule Take 40 mg by mouth daily.    Historical Provider, MD  HYDROcodone-acetaminophen (NORCO/VICODIN) 5-325 MG per tablet Take 1-2 tablets by mouth every 4 (four) hours as needed for moderate pain. 08/23/14   Hazel Sams, PA-C  ondansetron (ZOFRAN) 4 MG tablet Take 1 tablet (4 mg total) by mouth every 6 (six) hours. 01/09/16   Alfonzo Beers, MD   BP 92/72 mmHg  Pulse 66  Temp(Src) 98 F (36.7 C) (Oral)  Resp 16  Ht 5\' 2"  (1.575 m)  Wt 58.968 kg  BMI 23.77 kg/m2  SpO2 95%  Vitals reviewed Physical Exam  Physical Examination: General appearance - alert, well appearing, and in no distress Mental status - alert, oriented to person, place, and time Eyes -no conjunctival injection no scleral icterus Mouth - mucous membranes moist, pharynx normal without lesions Chest - clear to auscultation, no wheezes, rales or rhonchi, symmetric air  entry Heart - normal rate, regular rhythm, normal S1, S2, no murmurs, rubs, clicks or gallops Abdomen - soft, diffuse mild tenderness to palpation, no gaurding or rebound, nabs, nondistended, no masses or organomegaly Neurological - alert, oriented, normal speech Extremities - peripheral pulses normal, no pedal edema, no clubbing or cyanosis Skin - normal coloration and turgor, no rashes  ED Course  Procedures (including critical care time)  DIAGNOSTIC STUDIES: Oxygen Saturation is 97% on room air, normal by my interpretation.    COORDINATION OF CARE: 4:40 PM Discussed treatment plan with pt at bedside and pt agreed to plan.   Labs Review Labs Reviewed  CBC - Abnormal; Notable for the following:    RBC 3.52 (*)    Hemoglobin 11.8 (*)    HCT 35.2 (*)    All other components within normal limits  COMPREHENSIVE METABOLIC PANEL - Abnormal; Notable for the following:    Chloride 112 (*)    Total Protein 6.1 (*)    All other components within normal limits  URINALYSIS, ROUTINE W REFLEX MICROSCOPIC (NOT AT [facility]) - Abnormal; Notable for the following:    APPearance CLOUDY (*)    Hgb urine dipstick TRACE (*)    Leukocytes, UA LARGE (*)    All other components within normal limits  URINE MICROSCOPIC-ADD ON - Abnormal; Notable for the following:    Squamous Epithelial / LPF 0-5 (*)    Bacteria, UA MANY (*)    All other components within normal limits  URINE CULTURE  LIPASE, BLOOD    Imaging Review No results found. I have personally reviewed and evaluated these images and lab results as part of my medical decision-making.   EKG Interpretation None      MDM   Final diagnoses:  Vomiting and diarrhea  Generalized abdominal pain    Pt presenting with c/o vomiting and diarrhea which has been going on for the past week..  Labs are reassuring with exception of contaminated urine specimen- will send for culture.   Pt able to tolerate  Po fluids after nausea and pain meds.  Pt  with low blood pressure after morphine- this did respond to fluids and patient states her blood pressure runs low- sometimes in the 90s at her baseline.  Pt able to tolerate po fluids in the ED.  Discharged with strict return precautions.  Pt agreeable with plan.  I personally performed the services described in this documentation, which was scribed in my presence. The recorded information has been reviewed and is accurate.      Alfonzo Beers, MD 01/12/16 [phone]

## 2016-01-09 NOTE — ED Notes (Addendum)
Went in to assess the patient and give oral fluids for trial. The patient is holding stomach in pain. Then when asked if she could drink something she was ok and not holding stomach. The patient also calm and in no distress while talking about her cell phone. Patient has  Low BP - reported to MD  - orders received and carried out.  Patient is alert and oriented without any further complaints.

## 2016-01-09 NOTE — Discharge Instructions (Signed)
Return to the ED with any concerns including vomiting and not able to keep down liquids, fever/chills, fainting, worsening abdominal pain, blood in stool, decreased level of alertness/lethargy, or any other alarming symptoms

## 2016-01-09 NOTE — ED Notes (Signed)
C/o abd pain, diarrhea x 1 week-steady gait

## 2016-01-09 NOTE — ED Notes (Signed)
Patient walked out in no distress and no grimacing noted. Patient not guarding stomach at this time

## 2016-01-10 LAB — URINE CULTURE

## 2016-01-12 ENCOUNTER — Encounter: Payer: Self-pay | Admitting: Family Medicine

## 2016-03-25 ENCOUNTER — Encounter (HOSPITAL_BASED_OUTPATIENT_CLINIC_OR_DEPARTMENT_OTHER): Payer: Self-pay | Admitting: Emergency Medicine

## 2016-03-25 ENCOUNTER — Emergency Department (HOSPITAL_BASED_OUTPATIENT_CLINIC_OR_DEPARTMENT_OTHER)
Admission: EM | Admit: 2016-03-25 | Discharge: 2016-03-25 | Disposition: A | Discharge status: Home / Self Care | Payer: Federal, State, Local not specified - PPO | Attending: Emergency Medicine | Admitting: Emergency Medicine

## 2016-03-25 DIAGNOSIS — R51 Headache: Secondary | ICD-10-CM | POA: Diagnosis present

## 2016-03-25 DIAGNOSIS — Z79899 Other long term (current) drug therapy: Secondary | ICD-10-CM | POA: Insufficient documentation

## 2016-03-25 DIAGNOSIS — Z8709 Personal history of other diseases of the respiratory system: Secondary | ICD-10-CM | POA: Insufficient documentation

## 2016-03-25 DIAGNOSIS — G43809 Other migraine, not intractable, without status migrainosus: Secondary | ICD-10-CM | POA: Insufficient documentation

## 2016-03-25 DIAGNOSIS — F410 Panic disorder [episodic paroxysmal anxiety] without agoraphobia: Secondary | ICD-10-CM | POA: Diagnosis not present

## 2016-03-25 DIAGNOSIS — F17210 Nicotine dependence, cigarettes, uncomplicated: Secondary | ICD-10-CM | POA: Insufficient documentation

## 2016-03-25 DIAGNOSIS — G8929 Other chronic pain: Secondary | ICD-10-CM | POA: Diagnosis not present

## 2016-03-25 MED ORDER — METOCLOPRAMIDE HCL 10 MG PO TABS: 10.0000 mg | ORAL_TABLET | Freq: Four times a day (QID) | ORAL | Status: DC

## 2016-03-25 MED ORDER — HYDROCODONE-ACETAMINOPHEN 5-325 MG PO TABS: 1.0000 | ORAL_TABLET | Freq: Once | ORAL | Status: DC

## 2016-03-25 MED ORDER — DIPHENHYDRAMINE HCL 25 MG PO TABS: 25.0000 mg | ORAL_TABLET | Freq: Four times a day (QID) | ORAL | Status: DC

## 2016-03-25 MED ORDER — METOCLOPRAMIDE HCL 5 MG/ML IJ SOLN
10.0000 mg | Freq: Once | INTRAMUSCULAR | Status: AC
  Administered 2016-03-25: 10 mg via INTRAMUSCULAR
  Filled 2016-03-25: qty 2

## 2016-03-25 MED ORDER — KETOROLAC TROMETHAMINE 60 MG/2ML IM SOLN
60.0000 mg | Freq: Once | INTRAMUSCULAR | Status: AC
  Administered 2016-03-25: 60 mg via INTRAMUSCULAR
  Filled 2016-03-25: qty 2

## 2016-03-25 MED ORDER — DIPHENHYDRAMINE HCL 50 MG/ML IJ SOLN
25.0000 mg | Freq: Once | INTRAMUSCULAR | Status: AC
  Administered 2016-03-25: 25 mg via INTRAMUSCULAR
  Filled 2016-03-25: qty 1

## 2016-03-25 NOTE — Discharge Instructions (Signed)

## 2016-03-25 NOTE — ED Provider Notes (Signed)
CSN: [id number]     Arrival date & time 03/25/16  1738 History  By signing my name below, I, [facility], attest that this documentation has been prepared under the direction and in the presence of Charlesetta Shanks, MD. Electronically Signed: Virgel Bouquet, ED Scribe. 03/25/2016. 9:52 PM.   Chief Complaint  Patient presents with  . Migraine   The history is provided by the patient. No language interpreter was used.  HPI Comments: Angela Pineda is a 55 y.o. female with PMHx of migraines who presents to the Emergency Department complaining of constant, moderate HA behind her left eye onset 2.5 days ago. She states that she was ill last week with a stomach virus that resolved 4 days ago, followed by HA which has gradually worsened since onset. Patient reports associated nausea, body aches, and photophobia. She has taken oxycodone without relief. Per patient, she notes similar symptoms in the past that she associated with migraines but states that it has been years since her last migraine. Denies visual changes, weakness, vomiting, abdominal pain.  Past Medical History  Diagnosis Date  . Neck pain, chronic   . Chronic headaches   . Nerve damage   . Bronchitis   . Panic attacks   . Chronic back pain    Past Surgical History  Procedure Laterality Date  . Neck surgery  2002  . Kidney surgery    . Ablation on endometriosis     Family History  Problem Relation Age of Onset  . Hypertension Father   . Lung cancer Mother   . Emphysema Mother     Smoker   Social History  Substance Use Topics  . Smoking status: Current Every Day Smoker -- 1.00 packs/day    Types: Cigarettes  . Smokeless tobacco: None  . Alcohol Use: No   OB History    No data available     Review of Systems A complete 10 system review of systems was obtained and all systems are negative except as noted in the HPI and PMH.   Allergies  Anaprox and Tramadol  Home Medications   Prior to Admission  medications   Medication Sig Start Date End Date Taking? Authorizing Provider  clonazePAM (KLONOPIN) 0.5 MG tablet Take 0.5 mg by mouth 2 (two) times daily as needed for anxiety.   Yes Historical Provider, MD  ibuprofen (ADVIL,MOTRIN) 200 MG tablet Take 200 mg by mouth every 6 (six) hours as needed.   Yes Historical Provider, MD  dicyclomine (BENTYL) 20 MG tablet Take 1 tablet (20 mg total) by mouth 2 (two) times daily. 01/09/16   Alfonzo Beers, MD  diphenhydrAMINE (BENADRYL) 25 MG tablet Take 1 tablet (25 mg total) by mouth every 6 (six) hours. 03/25/16   Charlesetta Shanks, MD  HYDROcodone-acetaminophen (NORCO/VICODIN) 5-325 MG per tablet Take 1-2 tablets by mouth every 4 (four) hours as needed for moderate pain. 08/23/14   Hazel Sams, PA-C  metoCLOPramide (REGLAN) 10 MG tablet Take 1 tablet (10 mg total) by mouth every 6 (six) hours. 03/25/16   Charlesetta Shanks, MD   BP 80/54 mmHg  Pulse 72  Temp(Src) 98.1 F (36.7 C) (Oral)  Resp 16  Ht 5\' 2"  (1.575 m)  Wt 118 lb (53.524 kg)  BMI 21.58 kg/m2  SpO2 98% Physical Exam  Constitutional: She is oriented to person, place, and time. She appears well-developed and well-nourished. No distress.  HENT:  Head: Normocephalic and atraumatic.  Eyes: Conjunctivae are normal.  Neck: Normal range of motion.  Cardiovascular: Normal rate, regular rhythm and normal heart sounds.  Exam reveals no gallop and no friction rub.   No murmur heard. Pulmonary/Chest: Effort normal and breath sounds normal. No respiratory distress. She has no wheezes. She has no rhonchi. She has no rales.  Abdominal: Soft.  Musculoskeletal: Normal range of motion.  Neurological: She is alert and oriented to person, place, and time.  Skin: Skin is warm and dry.  Psychiatric: She has a normal mood and affect. Her behavior is normal.    ED Course  Procedures   DIAGNOSTIC STUDIES: Oxygen Saturation is 98% on RA, normal by my interpretation.    COORDINATION OF CARE: 8:04 PM Will order  Benadryl, Toradol, and Reglan. Discussed treatment plan with pt at bedside and pt agreed to plan.   MDM   Final diagnoses:  Other migraine without status migrainosus, not intractable   Patient with history of migraine headaches. She reports this is typical pain pattern behind her left eye with associated photophobia and nausea. Patient has no associated neurologic deficit. She does not have ill appearance, fever, constitutional symptoms. He has been treated for migraines Reglan and Benadryl with good result. Patient has been able to sleep and tolerate by mouth intake.   Charlesetta Shanks, MD 03/25/16 2153

## 2016-03-25 NOTE — ED Notes (Signed)
MD at bedside. 

## 2016-03-25 NOTE — ED Notes (Signed)
Pt reports migraine associated with stomach virus started last week, stomach virus has ended but migraine has worsened, pt has history of migraine

## 2016-05-15 DIAGNOSIS — M545 Low back pain: Secondary | ICD-10-CM | POA: Diagnosis not present

## 2016-05-15 DIAGNOSIS — S134XXS Sprain of ligaments of cervical spine, sequela: Secondary | ICD-10-CM | POA: Diagnosis not present

## 2016-05-15 DIAGNOSIS — M5136 Other intervertebral disc degeneration, lumbar region: Secondary | ICD-10-CM | POA: Diagnosis not present

## 2016-05-15 DIAGNOSIS — G894 Chronic pain syndrome: Secondary | ICD-10-CM | POA: Diagnosis not present

## 2016-07-04 DIAGNOSIS — F3342 Major depressive disorder, recurrent, in full remission: Secondary | ICD-10-CM | POA: Diagnosis not present

## 2016-07-04 DIAGNOSIS — F410 Panic disorder [episodic paroxysmal anxiety] without agoraphobia: Secondary | ICD-10-CM | POA: Diagnosis not present

## 2016-07-26 DIAGNOSIS — M5441 Lumbago with sciatica, right side: Secondary | ICD-10-CM | POA: Diagnosis not present

## 2016-07-26 DIAGNOSIS — F17210 Nicotine dependence, cigarettes, uncomplicated: Secondary | ICD-10-CM | POA: Diagnosis not present

## 2016-07-26 DIAGNOSIS — Z885 Allergy status to narcotic agent status: Secondary | ICD-10-CM | POA: Diagnosis not present

## 2016-07-26 DIAGNOSIS — G8929 Other chronic pain: Secondary | ICD-10-CM | POA: Diagnosis not present

## 2016-08-23 DIAGNOSIS — S134XXS Sprain of ligaments of cervical spine, sequela: Secondary | ICD-10-CM | POA: Diagnosis not present

## 2016-08-23 DIAGNOSIS — Z79891 Long term (current) use of opiate analgesic: Secondary | ICD-10-CM | POA: Diagnosis not present

## 2016-08-23 DIAGNOSIS — G894 Chronic pain syndrome: Secondary | ICD-10-CM | POA: Diagnosis not present

## 2016-08-23 DIAGNOSIS — M5136 Other intervertebral disc degeneration, lumbar region: Secondary | ICD-10-CM | POA: Diagnosis not present

## 2016-08-25 ENCOUNTER — Emergency Department (HOSPITAL_BASED_OUTPATIENT_CLINIC_OR_DEPARTMENT_OTHER)
Admission: EM | Admit: 2016-08-25 | Discharge: 2016-08-25 | Disposition: A | Discharge status: Home / Self Care | Payer: Federal, State, Local not specified - PPO | Attending: Emergency Medicine | Admitting: Emergency Medicine

## 2016-08-25 ENCOUNTER — Encounter (HOSPITAL_BASED_OUTPATIENT_CLINIC_OR_DEPARTMENT_OTHER): Payer: Self-pay | Admitting: Emergency Medicine

## 2016-08-25 DIAGNOSIS — Z79899 Other long term (current) drug therapy: Secondary | ICD-10-CM | POA: Diagnosis not present

## 2016-08-25 DIAGNOSIS — R51 Headache: Secondary | ICD-10-CM | POA: Diagnosis not present

## 2016-08-25 DIAGNOSIS — Y9389 Activity, other specified: Secondary | ICD-10-CM | POA: Insufficient documentation

## 2016-08-25 DIAGNOSIS — G8929 Other chronic pain: Secondary | ICD-10-CM | POA: Diagnosis not present

## 2016-08-25 DIAGNOSIS — G4489 Other headache syndrome: Secondary | ICD-10-CM

## 2016-08-25 DIAGNOSIS — Z79891 Long term (current) use of opiate analgesic: Secondary | ICD-10-CM | POA: Diagnosis not present

## 2016-08-25 DIAGNOSIS — Y92410 Unspecified street and highway as the place of occurrence of the external cause: Secondary | ICD-10-CM | POA: Diagnosis not present

## 2016-08-25 DIAGNOSIS — Y999 Unspecified external cause status: Secondary | ICD-10-CM | POA: Diagnosis not present

## 2016-08-25 DIAGNOSIS — M542 Cervicalgia: Secondary | ICD-10-CM | POA: Diagnosis not present

## 2016-08-25 DIAGNOSIS — F17210 Nicotine dependence, cigarettes, uncomplicated: Secondary | ICD-10-CM | POA: Insufficient documentation

## 2016-08-25 MED ORDER — METOCLOPRAMIDE HCL 5 MG/ML IJ SOLN
10.0000 mg | Freq: Once | INTRAMUSCULAR | Status: AC
  Administered 2016-08-25: 10 mg via INTRAMUSCULAR
  Filled 2016-08-25: qty 2

## 2016-08-25 MED ORDER — KETOROLAC TROMETHAMINE 30 MG/ML IJ SOLN
30.0000 mg | Freq: Once | INTRAMUSCULAR | Status: AC
  Administered 2016-08-25: 30 mg via INTRAMUSCULAR
  Filled 2016-08-25: qty 1
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
## 2016-08-25 NOTE — Discharge Instructions (Signed)

## 2016-09-20 ENCOUNTER — Emergency Department (HOSPITAL_BASED_OUTPATIENT_CLINIC_OR_DEPARTMENT_OTHER)
Admission: EM | Admit: 2016-09-20 | Discharge: 2016-09-20 | Disposition: A | Discharge status: Home / Self Care | Payer: Federal, State, Local not specified - PPO | Attending: Emergency Medicine | Admitting: Emergency Medicine

## 2016-09-20 ENCOUNTER — Encounter (HOSPITAL_BASED_OUTPATIENT_CLINIC_OR_DEPARTMENT_OTHER): Payer: Self-pay | Admitting: *Deleted

## 2016-09-20 DIAGNOSIS — F17210 Nicotine dependence, cigarettes, uncomplicated: Secondary | ICD-10-CM | POA: Insufficient documentation

## 2016-09-20 DIAGNOSIS — M5441 Lumbago with sciatica, right side: Secondary | ICD-10-CM | POA: Insufficient documentation

## 2016-09-20 DIAGNOSIS — M549 Dorsalgia, unspecified: Secondary | ICD-10-CM | POA: Diagnosis present

## 2016-09-20 DIAGNOSIS — G8929 Other chronic pain: Secondary | ICD-10-CM | POA: Diagnosis not present

## 2016-09-20 MED ORDER — DIAZEPAM 2 MG PO TABS
2.0000 mg | ORAL_TABLET | Freq: Once | ORAL | Status: AC
  Administered 2016-09-20: 2 mg via ORAL
  Filled 2016-09-20: qty 1

## 2016-09-20 MED ORDER — KETOROLAC TROMETHAMINE 60 MG/2ML IM SOLN
30.0000 mg | Freq: Once | INTRAMUSCULAR | Status: AC
  Administered 2016-09-20: 30 mg via INTRAMUSCULAR
  Filled 2016-09-20: qty 2

## 2016-09-20 MED ORDER — OXYCODONE HCL 5 MG PO TABS
5.0000 mg | ORAL_TABLET | Freq: Once | ORAL | Status: AC
Start: 2016-09-20 — End: 2016-09-20
  Administered 2016-09-20: 5 mg via ORAL
  Filled 2016-09-20: qty 1

## 2016-09-20 MED ORDER — ACETAMINOPHEN 500 MG PO TABS
1000.0000 mg | ORAL_TABLET | Freq: Once | ORAL | Status: AC
  Administered 2016-09-20: 1000 mg via ORAL
  Filled 2016-09-20: qty 2

## 2016-09-20 NOTE — ED Notes (Signed)
Pt states she takes Norco for chronic back pain. States she last took it around 1430 (1tab). Reports taking 600mg  Motrin around 1500.

## 2016-09-20 NOTE — Discharge Instructions (Signed)
Take 4 over the counter ibuprofen tablets 3 times a day or 2 over-the-counter naproxen tablets twice a day for pain.  Then take the pain medicine if you feel like you need it. Narcotics do not help with the pain, they only make you care about it less.  You can become addicted to this, people may break into your house to steal it.  It will constipate you.  If you drive under the influence of this medicine you can get a DUI.    
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
## 2016-09-20 NOTE — ED Provider Notes (Signed)
[facility] DEPT [facility] Provider Note   CSN: [id number] Arrival date & time: 09/20/16  1729     History   Chief Complaint Chief Complaint  Patient presents with  . Back Pain    HPI Angela Pineda is a 55 y.o. female.  55 yo F with a chief complaint of chronic back pain. This is worsened after lifting her granddaughter into a car seat yesterday. Pain is typical for her normal radiates down into her right buttock. Worse with movement palpation. Denies fevers or chills denies loss of bowel or bladder denies loss perirectal sensation. Denies weakness or numbness to either lower extremity.   The history is provided by the patient.  Back Pain   This is a new problem. The current episode started yesterday. The problem occurs constantly. The problem has not changed since onset.The pain is associated with no known injury. The pain is present in the lumbar spine. The quality of the pain is described as stabbing. Radiates to: right buttock pain. The pain is at a severity of 6/10. The pain is moderate. The pain is worse during the day. Pertinent negatives include no chest pain, no fever, no headaches and no dysuria.    Past Medical History:  Diagnosis Date  . Bronchitis   . Chronic back pain   . Chronic headaches   . Neck pain, chronic   . Nerve damage   . Panic attacks     Patient Active Problem List   Diagnosis Date Noted  . Radicular low back pain 08/06/2013    Past Surgical History:  Procedure Laterality Date  . ABLATION ON ENDOMETRIOSIS    . KIDNEY SURGERY    . NECK SURGERY  2002    OB History    No data available       Home Medications    Prior to Admission medications   Medication Sig Start Date End Date Taking? Authorizing Provider  clonazePAM (KLONOPIN) 0.5 MG tablet Take 0.5 mg by mouth 2 (two) times daily as needed for anxiety.    Historical Provider, MD  HYDROcodone-acetaminophen (NORCO/VICODIN) 5-325 MG per tablet Take 1-2 tablets by mouth every 4 (four)  hours as needed for moderate pain. 08/23/14   Hazel Sams, PA-C  metoCLOPramide (REGLAN) 10 MG tablet Take 1 tablet (10 mg total) by mouth every 6 (six) hours. 03/25/16   Charlesetta Shanks, MD    Family History Family History  Problem Relation Age of Onset  . Hypertension Father   . Lung cancer Mother   . Emphysema Mother     Smoker    Social History Social History  Substance Use Topics  . Smoking status: Current Every Day Smoker    Packs/day: 1.00    Types: Cigarettes  . Smokeless tobacco: Never Used  . Alcohol use No     Allergies   Anaprox [naproxen sodium] and Tramadol   Review of Systems Review of Systems  Constitutional: Negative for chills and fever.  HENT: Negative for congestion and rhinorrhea.   Eyes: Negative for redness and visual disturbance.  Respiratory: Negative for shortness of breath and wheezing.   Cardiovascular: Negative for chest pain and palpitations.  Gastrointestinal: Negative for nausea and vomiting.  Genitourinary: Negative for dysuria and urgency.  Musculoskeletal: Positive for arthralgias, back pain and myalgias.  Skin: Negative for pallor and wound.  Neurological: Negative for dizziness and headaches.     Physical Exam Updated Vital Signs BP 110/62   Pulse 72   Temp 98.2 F (36.8 C) (  Oral)   Resp 16   Ht 5\' 2"  (1.575 m)   Wt 120 lb (54.4 kg)   SpO2 96%   BMI 21.95 kg/m   Physical Exam  Constitutional: She is oriented to person, place, and time. She appears well-developed and well-nourished. No distress.  HENT:  Head: Normocephalic and atraumatic.  Eyes: EOM are normal. Pupils are equal, round, and reactive to light.  Neck: Normal range of motion. Neck supple.  Cardiovascular: Normal rate and regular rhythm.  Exam reveals no gallop and no friction rub.   No murmur heard. Pulmonary/Chest: Effort normal. She has no wheezes. She has no rales.  Abdominal: Soft. She exhibits no distension and no mass. There is no tenderness. There is  no guarding.  Musculoskeletal: She exhibits no edema or tenderness.       Arms: No noted clonus, reflexes 2+ bilat  Neurological: She is alert and oriented to person, place, and time.  Skin: Skin is warm and dry. She is not diaphoretic.  Psychiatric: She has a normal mood and affect. Her behavior is normal.  Nursing note and vitals reviewed.    ED Treatments / Results  Labs (all labs ordered are listed, but only abnormal results are displayed) Labs Reviewed - No data to display  EKG  EKG Interpretation None       Radiology No results found.  Procedures Procedures (including critical care time)  Medications Ordered in ED Medications  acetaminophen (TYLENOL) tablet 1,000 mg (not administered)  ketorolac (TORADOL) injection 30 mg (not administered)  diazepam (VALIUM) tablet 2 mg (not administered)  oxyCODONE (Oxy IR/ROXICODONE) immediate release tablet 5 mg (not administered)     Initial Impression / Assessment and Plan / ED Course  I have reviewed the triage vital signs and the nursing notes.  Pertinent labs & imaging results that were available during my care of the patient were reviewed by me and considered in my medical decision making (see chart for details).  Clinical Course    55 yo F with a cc of Chronic right-sided back pain. Mild worsening after a lifting injury. Patient with no red flags. Treat pain, discharge home.  5:53 PM:  I have discussed the diagnosis/risks/treatment options with the patient and family and believe the pt to be eligible for discharge home to follow-up with PCP. We also discussed returning to the ED immediately if new or worsening sx occur. We discussed the sx which are most concerning (e.g., cauda equina s/sx, fever) that necessitate immediate return. Medications administered to the patient during their visit and any new prescriptions provided to the patient are listed below.  Medications given during this visit Medications    acetaminophen (TYLENOL) tablet 1,000 mg (not administered)  ketorolac (TORADOL) injection 30 mg (not administered)  diazepam (VALIUM) tablet 2 mg (not administered)  oxyCODONE (Oxy IR/ROXICODONE) immediate release tablet 5 mg (not administered)     The patient appears reasonably screen and/or stabilized for discharge and I doubt any other medical condition or other [facility] requiring further screening, evaluation, or treatment in the ED at this time prior to discharge.    Final Clinical Impressions(s) / ED Diagnoses   Final diagnoses:  Chronic right-sided low back pain with right-sided sciatica    New Prescriptions New Prescriptions   No medications on file     Deno Etienne, DO 09/20/16 1753

## 2016-11-13 ENCOUNTER — Telehealth: Payer: Self-pay | Admitting: Family Medicine

## 2016-11-13 NOTE — Telephone Encounter (Signed)
Caller name:Laray Truman Hayward Relationship to patient:self Can be reached:[phone] Pharmacy:  Reason for call:Requesting referral to Dr Talmage Coin Brown-Neurosurgeon, for neck pain. Please advise.

## 2016-11-13 NOTE — Telephone Encounter (Signed)
I have not seen her in years

## 2016-11-13 NOTE — Telephone Encounter (Signed)
Patient would rather see someone in [place] area.

## 2016-11-14 NOTE — Telephone Encounter (Signed)
Lm on vm to call back to schedule a visit to reestablish care

## 2016-11-15 ENCOUNTER — Encounter: Payer: Self-pay | Admitting: Family Medicine

## 2016-11-15 NOTE — Telephone Encounter (Signed)
Pt informed PCP out of office until next week. Forwarded to PCP for review when returned.

## 2016-11-19 NOTE — Telephone Encounter (Signed)
Called patient and left a message for call back.  When patient calls back, please schedule her for an acute appt and advise her to bring in copy of MRI report with her to the visit.

## 2016-11-21 ENCOUNTER — Encounter: Payer: Self-pay | Admitting: Behavioral Health

## 2016-11-21 ENCOUNTER — Telehealth: Payer: Self-pay | Admitting: Behavioral Health

## 2016-11-21 NOTE — Telephone Encounter (Signed)
Pre-Visit Call completed with patient and chart updated.   Pre-Visit Info documented in Specialty Comments under [employer].    

## 2016-11-21 NOTE — Addendum Note (Signed)
Addended by: Eduard Roux E on: 11/21/2016 12:38 PM   Modules accepted: Orders

## 2016-11-21 NOTE — Telephone Encounter (Signed)
Unable to reach patient at time of Pre-Visit Call.  Left message for patient to return call when available.    

## 2016-11-21 NOTE — Telephone Encounter (Signed)
Patient returned your call for a pre-visit.

## 2016-11-22 ENCOUNTER — Telehealth: Payer: Self-pay | Admitting: Family Medicine

## 2016-11-22 ENCOUNTER — Ambulatory Visit: Payer: Self-pay | Admitting: Family Medicine

## 2016-11-22 DIAGNOSIS — Z0289 Encounter for other administrative examinations: Secondary | ICD-10-CM

## 2016-11-22 NOTE — Telephone Encounter (Signed)
Patient lvm 8:14am cancelling her new patient appointment, charge or no charge

## 2016-11-22 NOTE — Telephone Encounter (Signed)
Charge. TY. 

## 2016-11-23 ENCOUNTER — Encounter: Payer: Self-pay | Admitting: Family Medicine

## 2017-01-09 DIAGNOSIS — F410 Panic disorder [episodic paroxysmal anxiety] without agoraphobia: Secondary | ICD-10-CM | POA: Diagnosis not present

## 2017-01-09 DIAGNOSIS — F3342 Major depressive disorder, recurrent, in full remission: Secondary | ICD-10-CM | POA: Diagnosis not present

## 2017-01-13 ENCOUNTER — Emergency Department (HOSPITAL_BASED_OUTPATIENT_CLINIC_OR_DEPARTMENT_OTHER)
Admission: EM | Admit: 2017-01-13 | Discharge: 2017-01-14 | Disposition: A | Discharge status: Home / Self Care | Payer: Federal, State, Local not specified - PPO | Attending: Emergency Medicine | Admitting: Emergency Medicine

## 2017-01-13 ENCOUNTER — Encounter (HOSPITAL_BASED_OUTPATIENT_CLINIC_OR_DEPARTMENT_OTHER): Payer: Self-pay | Admitting: Emergency Medicine

## 2017-01-13 DIAGNOSIS — F17210 Nicotine dependence, cigarettes, uncomplicated: Secondary | ICD-10-CM | POA: Diagnosis not present

## 2017-01-13 DIAGNOSIS — R197 Diarrhea, unspecified: Secondary | ICD-10-CM | POA: Diagnosis present

## 2017-01-13 DIAGNOSIS — K529 Noninfective gastroenteritis and colitis, unspecified: Secondary | ICD-10-CM | POA: Insufficient documentation

## 2017-01-13 LAB — COMPREHENSIVE METABOLIC PANEL
ALT: 13 U/L — ABNORMAL LOW (ref 14–54)
AST: 20 U/L (ref 15–41)
Albumin: 3.6 g/dL (ref 3.5–5.0)
Alkaline Phosphatase: 51 U/L (ref 38–126)
Anion gap: 4 — ABNORMAL LOW (ref 5–15)
BUN: 14 mg/dL (ref 6–20)
CO2: 26 mmol/L (ref 22–32)
Calcium: 8.8 mg/dL — ABNORMAL LOW (ref 8.9–10.3)
Chloride: 109 mmol/L (ref 101–111)
Creatinine, Ser: 0.67 mg/dL (ref 0.44–1.00)
GFR calc Af Amer: >60 mL/min (ref >60)
GFR calc non Af Amer: >60 mL/min (ref >60)
Glucose, Bld: 93 mg/dL (ref 65–99)
Potassium: 3.5 mmol/L (ref 3.5–5.1)
Sodium: 139 mmol/L (ref 135–145)
Total Bilirubin: 0.1 mg/dL — ABNORMAL LOW (ref 0.3–1.2)
Total Protein: 5.8 g/dL — ABNORMAL LOW (ref 6.5–8.1)

## 2017-01-13 LAB — CBC WITH DIFFERENTIAL/PLATELET
Basophils Absolute: 0 10*3/uL (ref 0.0–0.1)
Basophils Relative: 1 %
Eosinophils Absolute: 0.2 10*3/uL (ref 0.0–0.7)
Eosinophils Relative: 4 %
HCT: 34.3 % — ABNORMAL LOW (ref 36.0–46.0)
Hemoglobin: 11.5 g/dL — ABNORMAL LOW (ref 12.0–15.0)
Lymphocytes Relative: 46 %
Lymphs Abs: 2.7 10*3/uL (ref 0.7–4.0)
MCH: 33.8 pg (ref 26.0–34.0)
MCHC: 33.5 g/dL (ref 30.0–36.0)
MCV: 100.9 fL — ABNORMAL HIGH (ref 78.0–100.0)
Monocytes Absolute: 0.5 10*3/uL (ref 0.1–1.0)
Monocytes Relative: 8 %
Neutro Abs: 2.3 10*3/uL (ref 1.7–7.7)
Neutrophils Relative %: 41 %
Platelets: 180 10*3/uL (ref 150–400)
RBC: 3.4 MIL/uL — ABNORMAL LOW (ref 3.87–5.11)
RDW: 13.7 % (ref 11.5–15.5)
WBC: 5.8 10*3/uL (ref 4.0–10.5)

## 2017-01-13 LAB — LIPASE, BLOOD: Lipase: 21 U/L (ref 11–51)

## 2017-01-13 MED ORDER — ONDANSETRON HCL 4 MG/2ML IJ SOLN
4.0000 mg | Freq: Once | INTRAMUSCULAR | Status: AC
  Administered 2017-01-13: 4 mg via INTRAVENOUS
  Filled 2017-01-13: qty 2

## 2017-01-13 MED ORDER — SODIUM CHLORIDE 0.9 % IV BOLUS (SEPSIS)
1000.0000 mL | Freq: Once | INTRAVENOUS | Status: AC
  Administered 2017-01-13: 1000 mL via INTRAVENOUS

## 2017-01-13 MED ORDER — MORPHINE SULFATE (PF) 4 MG/ML IV SOLN
4.0000 mg | Freq: Once | INTRAVENOUS | Status: AC
  Administered 2017-01-13: 4 mg via INTRAVENOUS
  Filled 2017-01-13: qty 1

## 2017-01-13 MED ORDER — SODIUM CHLORIDE 0.9 % IV BOLUS (SEPSIS)
1000.0000 mL | Freq: Once | INTRAVENOUS | Status: AC
  Administered 2017-01-14: 1000 mL via INTRAVENOUS

## 2017-01-13 NOTE — ED Provider Notes (Signed)
[facility] DEPT [facility] Provider Note   CSN: [id number] Arrival date & time: 01/13/17  1900   By signing my name below, I, Eunice Blase, attest that this documentation has been prepared under the direction and in the presence of [employer], PA-C. Electronically Signed: Eunice Blase, Scribe. 01/13/17. 10:02 PM.   History   Chief Complaint Chief Complaint  Patient presents with  . Diarrhea   The history is provided by the patient and medical records. No language interpreter was used.    HPI Comments: Angela Pineda is a 56 y.o. female who presents to the Emergency Department complaining of Diarrhea 6 times today. Pt reports associated N/V x 3 days that subsided last night, excessive thirst, generalized consisten abdominal pain and abdominal cramping following diarrhea. Pt denies fever, bloody stool, recent abx use, abnormal food intake or long distance travel. She notes 1 sick contact at home with similar symptoms.  Past Medical History:  Diagnosis Date  . Bronchitis   . Chronic back pain   . Chronic headaches   . Neck pain, chronic   . Nerve damage   . Panic attacks     Patient Active Problem List   Diagnosis Date Noted  . Radicular low back pain 08/06/2013    Past Surgical History:  Procedure Laterality Date  . ABLATION ON ENDOMETRIOSIS    . KIDNEY SURGERY    . NECK SURGERY  2002    OB History    No data available       Home Medications    Prior to Admission medications   Medication Sig Start Date End Date Taking? Authorizing Provider  clonazePAM (KLONOPIN) 0.5 MG tablet Take 0.5 mg by mouth 2 (two) times daily as needed for anxiety.    Historical Provider, MD  HYDROcodone-acetaminophen (NORCO/VICODIN) 5-325 MG per tablet Take 1-2 tablets by mouth every 4 (four) hours as needed for moderate pain. 08/23/14   [name], PA-C  ondansetron (ZOFRAN) 4 MG tablet Take 1 tablet (4 mg total) by mouth every 6 (six) hours. 01/14/17   [name], PA-C     Family History Family History  Problem Relation Age of Onset  . Hypertension Father   . Lung cancer Mother   . Emphysema Mother     Smoker    Social History Social History  Substance Use Topics  . Smoking status: Current Every Day Smoker    Packs/day: 1.00    Types: Cigarettes  . Smokeless tobacco: Never Used  . Alcohol use No     Allergies   Anaprox [naproxen sodium] and Tramadol   Review of Systems Review of Systems  All other systems reviewed and are negative.  A complete 10 system review of systems was obtained and all systems are negative except as noted in the HPI and PMH.    Physical Exam Updated Vital Signs BP 103/56 (BP Location: Right Arm)   Pulse 72   Temp 98.5 F (36.9 C) (Oral)   Resp 18   Ht 5\' 2"  (1.575 m)   Wt 120 lb (54.4 kg)   SpO2 100%   BMI 21.95 kg/m   Physical Exam  Constitutional: She is oriented to person, place, and time. She appears well-developed and well-nourished.  HENT:  Head: Normocephalic and atraumatic.  Eyes: Conjunctivae are normal. Pupils are equal, round, and reactive to light. Right eye exhibits no discharge. Left eye exhibits no discharge. No scleral icterus.  Neck: Normal range of motion. No JVD present. No tracheal deviation present.  Cardiovascular: Normal rate, regular rhythm, normal heart sounds and intact distal pulses.   Pulmonary/Chest: Effort normal and breath sounds normal. No stridor.  Abdominal: There is tenderness.  Generalized abdominal tenderness that is non focal.  Neurological: She is alert and oriented to person, place, and time. Coordination normal.  Psychiatric: She has a normal mood and affect. Her behavior is normal. Judgment and thought content normal.  Nursing note and vitals reviewed.   ED Treatments / Results  DIAGNOSTIC STUDIES: Oxygen Saturation is 100% on RA, normal by my interpretation.    COORDINATION OF CARE: 9:50 PM Discussed treatment plan with pt at bedside and pt agreed to  plan. Will order IV fluids and medications for nausea and pain.  Labs (all labs ordered are listed, but only abnormal results are displayed) Labs Reviewed  CBC WITH DIFFERENTIAL/PLATELET - Abnormal; Notable for the following:       Result Value   RBC 3.40 (*)    Hemoglobin 11.5 (*)    HCT 34.3 (*)    MCV 100.9 (*)    All other components within normal limits  COMPREHENSIVE METABOLIC PANEL - Abnormal; Notable for the following:    Calcium 8.8 (*)    Total Protein 5.8 (*)    ALT 13 (*)    Total Bilirubin 0.1 (*)    Anion gap 4 (*)    All other components within normal limits  LIPASE, BLOOD  URINALYSIS, ROUTINE W REFLEX MICROSCOPIC    EKG  EKG Interpretation None       Radiology No results found.  Procedures Procedures (including critical care time)  Medications Ordered in ED Medications  sodium chloride 0.9 % bolus 1,000 mL (0 mLs Intravenous Stopped 01/13/17 2325)  morphine 4 MG/ML injection 4 mg (4 mg Intravenous Given 01/13/17 2213)  ondansetron (ZOFRAN) injection 4 mg (4 mg Intravenous Given 01/13/17 2213)  sodium chloride 0.9 % bolus 1,000 mL (1,000 mLs Intravenous New Bag/Given 01/14/17 0000)     Initial Impression / Assessment and Plan / ED Course  I have reviewed the triage vital signs and the nursing notes.  Pertinent labs & imaging results that were available during my care of the patient were reviewed by me and considered in my medical decision making (see chart for details).     I personally performed the services described in this documentation, which was scribed in my presence. The recorded information has been reviewed and is accurate.  Final Clinical Impressions(s) / ED Diagnoses   Final diagnoses:  Gastroenteritis    56 year old female presents today with likely gastroenteritis. Patient initially with lower blood pressures and abdominal discomfort in the setting of diarrhea. She was given 2 L of fluid here which significantly improved her blood  pressure, her abdomen was soft with generalized tenderness, nonfocal. Upon reassessment abdomen is nontender patient is tolerating by mouth in no acute distress. She has reassuring vital signs, reassuring laboratory analysis. I have low suspicion for significant intra-abdominal pathology. Patient will be discharged home with Zofran, primary care follow-up, and strict return precautions. She verbalized her understanding and agreement to today's plan had no further questions or concerns at the time discharge  New Prescriptions New Prescriptions   ONDANSETRON (ZOFRAN) 4 MG TABLET    Take 1 tablet (4 mg total) by mouth every 6 (six) hours.     [name], PA-C 01/14/17 [id number]    Gareth Morgan, MD 01/15/17 1729

## 2017-01-13 NOTE — ED Triage Notes (Signed)
Patient states that she had N/V x 2 -3 days - it stopped today however she is now having loose stools  - patient also reports abdominal cramping

## 2017-01-14 MED ORDER — ONDANSETRON HCL 4 MG PO TABS: 4.0000 mg | ORAL_TABLET | Freq: Four times a day (QID) | ORAL | 0 refills | Status: DC

## 2017-01-14 NOTE — Discharge Instructions (Signed)
Please read attached information. If you experience any new or worsening signs or symptoms please return to the emergency room for evaluation. Please follow-up with your primary care provider or specialist as discussed. Please use medication prescribed only as directed and discontinue taking if you have any concerning signs or symptoms.   °

## 2017-01-19 ENCOUNTER — Observation Stay (HOSPITAL_BASED_OUTPATIENT_CLINIC_OR_DEPARTMENT_OTHER)
Admission: EM | Admit: 2017-01-19 | Discharge: 2017-01-20 | Disposition: A | Discharge status: Home / Self Care | Payer: Federal, State, Local not specified - PPO | Attending: Internal Medicine | Admitting: Internal Medicine

## 2017-01-19 ENCOUNTER — Emergency Department (HOSPITAL_BASED_OUTPATIENT_CLINIC_OR_DEPARTMENT_OTHER): Payer: Federal, State, Local not specified - PPO

## 2017-01-19 ENCOUNTER — Encounter (HOSPITAL_BASED_OUTPATIENT_CLINIC_OR_DEPARTMENT_OTHER): Payer: Self-pay | Admitting: Emergency Medicine

## 2017-01-19 DIAGNOSIS — D61818 Other pancytopenia: Secondary | ICD-10-CM | POA: Diagnosis not present

## 2017-01-19 DIAGNOSIS — G8929 Other chronic pain: Secondary | ICD-10-CM | POA: Insufficient documentation

## 2017-01-19 DIAGNOSIS — Z79899 Other long term (current) drug therapy: Secondary | ICD-10-CM | POA: Insufficient documentation

## 2017-01-19 DIAGNOSIS — R109 Unspecified abdominal pain: Secondary | ICD-10-CM | POA: Diagnosis not present

## 2017-01-19 DIAGNOSIS — D649 Anemia, unspecified: Secondary | ICD-10-CM | POA: Diagnosis not present

## 2017-01-19 DIAGNOSIS — F419 Anxiety disorder, unspecified: Secondary | ICD-10-CM | POA: Insufficient documentation

## 2017-01-19 DIAGNOSIS — R740 Nonspecific elevation of levels of transaminase and lactic acid dehydrogenase [LDH]: Secondary | ICD-10-CM | POA: Insufficient documentation

## 2017-01-19 DIAGNOSIS — R197 Diarrhea, unspecified: Secondary | ICD-10-CM

## 2017-01-19 DIAGNOSIS — I959 Hypotension, unspecified: Secondary | ICD-10-CM | POA: Diagnosis not present

## 2017-01-19 DIAGNOSIS — R112 Nausea with vomiting, unspecified: Secondary | ICD-10-CM | POA: Insufficient documentation

## 2017-01-19 DIAGNOSIS — E860 Dehydration: Principal | ICD-10-CM | POA: Insufficient documentation

## 2017-01-19 DIAGNOSIS — F17210 Nicotine dependence, cigarettes, uncomplicated: Secondary | ICD-10-CM | POA: Insufficient documentation

## 2017-01-19 DIAGNOSIS — R51 Headache: Secondary | ICD-10-CM | POA: Diagnosis not present

## 2017-01-19 LAB — COMPREHENSIVE METABOLIC PANEL
ALK PHOS: 60 U/L (ref 38–126)
ALT: 16 U/L (ref 14–54)
AST: 24 U/L (ref 15–41)
Albumin: 3.4 g/dL — ABNORMAL LOW (ref 3.5–5.0)
Anion gap: 5 (ref 5–15)
BILIRUBIN TOTAL: 0.4 mg/dL (ref 0.3–1.2)
BUN: 10 mg/dL (ref 6–20)
CALCIUM: 8.5 mg/dL — AB (ref 8.9–10.3)
CO2: 27 mmol/L (ref 22–32)
CREATININE: 0.61 mg/dL (ref 0.44–1.00)
Chloride: 104 mmol/L (ref 101–111)
GFR calc non Af Amer: >60 mL/min (ref >60)
Glucose, Bld: 85 mg/dL (ref 65–99)
Potassium: 3.6 mmol/L (ref 3.5–5.1)
SODIUM: 136 mmol/L (ref 135–145)
TOTAL PROTEIN: 5.6 g/dL — AB (ref 6.5–8.1)

## 2017-01-19 LAB — CBC WITH DIFFERENTIAL/PLATELET
Basophils Absolute: 0 10*3/uL (ref 0.0–0.1)
Basophils Relative: 0 %
EOS ABS: 0.2 10*3/uL (ref 0.0–0.7)
Eosinophils Relative: 3 %
HEMATOCRIT: 33.3 % — AB (ref 36.0–46.0)
HEMOGLOBIN: 11.1 g/dL — AB (ref 12.0–15.0)
LYMPHS ABS: 0.9 10*3/uL (ref 0.7–4.0)
LYMPHS PCT: 13 %
MCH: 33.7 pg (ref 26.0–34.0)
MCHC: 33.3 g/dL (ref 30.0–36.0)
MCV: 101.2 fL — ABNORMAL HIGH (ref 78.0–100.0)
Monocytes Absolute: 0.3 10*3/uL (ref 0.1–1.0)
Monocytes Relative: 4 %
NEUTROS ABS: 5.6 10*3/uL (ref 1.7–7.7)
NEUTROS PCT: 80 %
Platelets: 175 10*3/uL (ref 150–400)
RBC: 3.29 MIL/uL — AB (ref 3.87–5.11)
RDW: 14.2 % (ref 11.5–15.5)
WBC: 7 10*3/uL (ref 4.0–10.5)

## 2017-01-19 LAB — URINALYSIS, MICROSCOPIC (REFLEX)

## 2017-01-19 LAB — URINALYSIS, ROUTINE W REFLEX MICROSCOPIC
BILIRUBIN URINE: NEGATIVE
GLUCOSE, UA: NEGATIVE mg/dL
Hgb urine dipstick: NEGATIVE
KETONES UR: NEGATIVE mg/dL
NITRITE: NEGATIVE
PROTEIN: NEGATIVE mg/dL
Specific Gravity, Urine: 1.012 (ref 1.005–1.030)
pH: 8.5 — ABNORMAL HIGH (ref 5.0–8.0)

## 2017-01-19 LAB — I-STAT CG4 LACTIC ACID, ED: Lactic Acid, Venous: 0.51 mmol/L (ref 0.5–1.9)

## 2017-01-19 MED ORDER — ONDANSETRON HCL 4 MG/2ML IJ SOLN
4.0000 mg | Freq: Once | INTRAMUSCULAR | Status: AC
  Administered 2017-01-19: 4 mg via INTRAVENOUS
  Filled 2017-01-19: qty 2

## 2017-01-19 MED ORDER — SODIUM CHLORIDE 0.9 % IV BOLUS (SEPSIS)
1000.0000 mL | Freq: Once | INTRAVENOUS | Status: AC
  Administered 2017-01-19: 1000 mL via INTRAVENOUS

## 2017-01-19 MED ORDER — KETOROLAC TROMETHAMINE 15 MG/ML IJ SOLN
15.0000 mg | Freq: Once | INTRAMUSCULAR | Status: AC
  Administered 2017-01-19: 15 mg via INTRAVENOUS
  Filled 2017-01-19: qty 1

## 2017-01-19 MED ORDER — DICYCLOMINE HCL 10 MG PO CAPS
10.0000 mg | ORAL_CAPSULE | Freq: Once | ORAL | Status: AC
  Administered 2017-01-19: 10 mg via ORAL
  Filled 2017-01-19: qty 1

## 2017-01-19 MED ORDER — IOPAMIDOL (ISOVUE-300) INJECTION 61%
100.0000 mL | Freq: Once | INTRAVENOUS | Status: AC | PRN
  Administered 2017-01-19: 100 mL via INTRAVENOUS

## 2017-01-19 MED ORDER — MORPHINE SULFATE (PF) 4 MG/ML IV SOLN: 4.0000 mg | Freq: Once | INTRAVENOUS | Status: DC

## 2017-01-19 NOTE — ED Notes (Signed)
ED Provider at bedside. 

## 2017-01-19 NOTE — ED Notes (Signed)
Drinking contrast.

## 2017-01-19 NOTE — ED Provider Notes (Signed)
[facility] DEPT [facility] Provider Note   CSN: [id number] Arrival date & time: 01/19/17  1640  By signing my name below, I, [name], attest that this documentation has been prepared under the direction and in the presence of Josh Taeshawn Helfman PA-C. Electronically Signed: [name], Scribe. 01/19/17. 7:45 PM.   History   Chief Complaint Chief Complaint  Patient presents with  . Emesis    HPI Angela Pineda Angela Pineda is a 56 y.o. female who presents to the Emergency Department complaining of gradual onset, moderate, diffuse abdominal pain. Pt has associated nausea, coughing, congestion. Pt has diarrhea multiple times a day, with slight foul smell. Pt is unable to eat or drink due to nausea. Pt has had positive sick contact from her grand daughters. Pt has been taking Zofran with limited relief. Pt has had surgery for an ovarian cyst, reconstruction of a kidney ureter. Pt denies leg swelling. Denies recent antibiotics, travel, heavy NSAID use, alcohol use. She does not take any medications regularly.    The history is provided by the patient. No language interpreter was used.    Past Medical History:  Diagnosis Date  . Bronchitis   . Chronic back pain   . Chronic headaches   . Neck pain, chronic   . Nerve damage   . Panic attacks     Patient Active Problem List   Diagnosis Date Noted  . Radicular low back pain 08/06/2013    Past Surgical History:  Procedure Laterality Date  . ABLATION ON ENDOMETRIOSIS    . KIDNEY SURGERY    . NECK SURGERY  2002    OB History    No data available       Home Medications    Prior to Admission medications   Medication Sig Start Date End Date Taking? Authorizing Provider  clonazePAM (KLONOPIN) 0.5 MG tablet Take 0.5 mg by mouth 2 (two) times daily as needed for anxiety.    Historical Provider, MD  HYDROcodone-acetaminophen (NORCO/VICODIN) 5-325 MG per tablet Take 1-2 tablets by mouth every 4 (four) hours as needed for moderate pain. 08/23/14   Peter  Dammen, PA-C  ondansetron (ZOFRAN) 4 MG tablet Take 1 tablet (4 mg total) by mouth every 6 (six) hours. 01/14/17   [name], PA-C    Family History Family History  Problem Relation Age of Onset  . Hypertension Father   . Lung cancer Mother   . Emphysema Mother     Smoker    Social History Social History  Substance Use Topics  . Smoking status: Current Every Day Smoker    Packs/day: 1.00    Types: Cigarettes  . Smokeless tobacco: Never Used  . Alcohol use No     Allergies   Anaprox [naproxen sodium] and Tramadol   Review of Systems Review of Systems  Constitutional: Negative for fever.  HENT: Positive for congestion. Negative for rhinorrhea and sore throat.   Eyes: Negative for redness.  Respiratory: Positive for cough.   Cardiovascular: Negative for chest pain and leg swelling.  Gastrointestinal: Positive for abdominal pain, diarrhea and nausea. Negative for vomiting.  Genitourinary: Negative for dysuria.  Musculoskeletal: Negative for myalgias.  Skin: Negative for rash.  Neurological: Negative for headaches.     Physical Exam Updated Vital Signs BP (!) 110/36 (BP Location: Right Arm)   Pulse 73   Temp 99 F (37.2 C) (Oral)   Resp 18   Ht 5\' 2"  (1.575 m)   Wt 120 lb (54.4 kg)   SpO2 100%  BMI 21.95 kg/m   Physical Exam  Constitutional: She appears well-developed and well-nourished.  HENT:  Head: Normocephalic and atraumatic.  Mouth/Throat: Oropharynx is clear and moist. Mucous membranes are dry.  Eyes: Conjunctivae are normal. Right eye exhibits no discharge. Left eye exhibits no discharge.  Neck: Normal range of motion. Neck supple. No JVD present.  Cardiovascular: Normal rate, regular rhythm and normal heart sounds.   Pulmonary/Chest: Effort normal and breath sounds normal. No respiratory distress. She has no wheezes. She has no rales.  Abdominal: Soft. She exhibits no mass. There is tenderness (generalized tenderness, mild-moderate). There is  no guarding.  Neurological: She is alert.  Skin: Skin is warm. There is pallor.  Psychiatric: She has a normal mood and affect.  Nursing note and vitals reviewed.    ED Treatments / Results   DIAGNOSTIC STUDIES: Oxygen Saturation is 100% on room air, normal by my interpretation.    COORDINATION OF CARE: 5:10 PM Discussed treatment plan with pt at bedside and pt agreed to plan, which includes collecting stool sample if possible, and continued anti-nausea medication.  Pt is persistently hypotensive into the upper [id number] systolic.   7:45 PM Discussed treatment and scheduled a CT scan.   Labs (all labs ordered are listed, but only abnormal results are displayed) Labs Reviewed  CBC WITH DIFFERENTIAL/PLATELET - Abnormal; Notable for the following:       Result Value   RBC 3.29 (*)    Hemoglobin 11.1 (*)    HCT 33.3 (*)    MCV 101.2 (*)    All other components within normal limits  COMPREHENSIVE METABOLIC PANEL - Abnormal; Notable for the following:    Calcium 8.5 (*)    Total Protein 5.6 (*)    Albumin 3.4 (*)    All other components within normal limits  URINALYSIS, ROUTINE W REFLEX MICROSCOPIC - Abnormal; Notable for the following:    APPearance CLOUDY (*)    pH 8.5 (*)    Leukocytes, UA LARGE (*)    All other components within normal limits  URINALYSIS, MICROSCOPIC (REFLEX) - Abnormal; Notable for the following:    Bacteria, UA FEW (*)    Squamous Epithelial / LPF 0-5 (*)    All other components within normal limits  C DIFFICILE QUICK SCREEN W PCR REFLEX  GASTROINTESTINAL PANEL BY PCR, STOOL (REPLACES STOOL CULTURE)  I-STAT CG4 LACTIC ACID, ED    Radiology Ct Abdomen Pelvis W Contrast  Result Date: 01/19/2017 CLINICAL DATA:  Diffuse abdominal pain and nausea. Seen on January 29 for the same. EXAM: CT ABDOMEN AND PELVIS WITH CONTRAST TECHNIQUE: Multidetector CT imaging of the abdomen and pelvis was performed using the standard protocol following bolus administration of  intravenous contrast. CONTRAST:  15mL ISOVUE-300 IOPAMIDOL (ISOVUE-300) INJECTION 61% COMPARISON:  05/31/2012 FINDINGS: Lower chest: No acute abnormality. Mild linear scarring in the bases. Hepatobiliary: No focal liver abnormality. There is periportal edema, nonspecific. No gallstones, gallbladder wall thickening, or biliary dilatation. Pancreas: Unremarkable. No pancreatic ductal dilatation or surrounding inflammatory changes. Spleen: Normal in size without focal abnormality. Adrenals/Urinary Tract: Adrenal glands are unremarkable. Kidneys are normal, without renal calculi, focal lesion, or hydronephrosis. Bladder is unremarkable. Stomach/Bowel: Stomach is within normal limits. Appendix appears normal. No evidence of bowel wall thickening, distention, or inflammatory changes. Enteric contrast has reached the cecum. Vascular/Lymphatic: The abdominal aorta is normal in caliber with minimal atherosclerotic calcification. No adenopathy. Reproductive: Uterus and bilateral adnexa are unremarkable. Other: No ascites.  No focal inflammation. Musculoskeletal: No significant  skeletal lesion. Severe facet arthritis from L4 through the sacrum with mild degenerative spondylolisthesis at L4-5 and L5-S1 IMPRESSION: 1. No acute inflammatory changes in the abdomen or pelvis. 2. Periportal edema in the liver, a nonspecific finding that sometimes merely reflects hydration state. This may be viewed with greater suspicion if there are liver function test abnormalities. Normal gallbladder and bile ducts. 3. Severe facet arthritis in the lower lumbar spine. Electronically Signed   By: Andreas Newport M.D.   On: 01/19/2017 21:41    Procedures Procedures (including critical care time)  Medications Ordered in ED Medications  morphine 4 MG/ML injection 4 mg (4 mg Intravenous Not Given 01/19/17 1955)  sodium chloride 0.9 % bolus 1,000 mL (0 mLs Intravenous Stopped 01/19/17 1847)  ketorolac (TORADOL) 15 MG/ML injection 15 mg (15 mg  Intravenous Given 01/19/17 1803)  ondansetron (ZOFRAN) injection 4 mg (4 mg Intravenous Given 01/19/17 1804)  dicyclomine (BENTYL) capsule 10 mg (10 mg Oral Given 01/19/17 1946)  ondansetron (ZOFRAN) injection 4 mg (4 mg Intravenous Given 01/19/17 1952)  sodium chloride 0.9 % bolus 1,000 mL (0 mLs Intravenous Stopped 01/19/17 2047)  iopamidol (ISOVUE-300) 61 % injection 100 mL (100 mLs Intravenous Contrast Given 01/19/17 2116)  sodium chloride 0.9 % bolus 1,000 mL (0 mLs Intravenous Stopped 01/19/17 2255)     Initial Impression / Assessment and Plan / ED Course  I have reviewed the triage vital signs and the nursing notes.  Pertinent labs & imaging results that were available during my care of the patient were reviewed by me and considered in my medical decision making (see chart for details).      Vital signs reviewed and are as follows: BP 105/58   Pulse 78   Temp 99.4 F (37.4 C) (Oral)   Resp 18   Ht 5\' 2"  (1.575 m)   Wt 54.4 kg   SpO2 94%   BMI 21.95 kg/m   Patient seen by and discussed with Dr. Winfred Leeds.   She is on her 3rd liter of fluids. Continues to be hypotensive. She does not appear well. Labs reassuring.   Will request obs admit.   Spoke with Dr. Loleta Books who will accept patient. Will obtain stool studies if possible. Accepted at [facility].   Pt updated.    Final Clinical Impressions(s) / ED Diagnoses   Final diagnoses:  Hypotension, unspecified hypotension type  Diarrhea, unspecified type   Admit for persistent Hypotension and dehydration in setting of persistent diarrheal illness.  New Prescriptions New Prescriptions   No medications on file     Carlisle Cater, PA-C 01/19/17 2308    Orlie Dakin, MD 01/19/17 [phone]

## 2017-01-19 NOTE — ED Notes (Signed)
Patient transported to CT 

## 2017-01-19 NOTE — Progress Notes (Signed)
56 yo F with no sig PMH presents with 1 week vomiting, now persistent diarrhea. Feels miserable.   BP 100/65   Pulse 74   Temp 99.4 F (37.4 C) (Oral)   Resp 18   Ht 5\' 2"  (1.575 m)   Wt 54.4 kg (120 lb)   SpO2 91%   BMI 21.95 kg/m   Na 136, K 3.6, Cr 0.61, WBC 7.0, Hgb 11.1 (normocytic, no hx bleeding)  Lactic acid normal.   CT abdomen unremarkable No recent antibiotics.   Appears worse than numbers.   Got 3L, BP 100/60 (previous baseline in system   Med surg, OBS status.  No stools yet in ER, but if has one, will send for Cdiff and GI path panel, otherwise will collect here.

## 2017-01-19 NOTE — ED Provider Notes (Signed)
Complains of multiple episodes of diarrhea for approximate the past one week last vomited 2 or 3 days ago. She complains of lower crampy abdominal pain which waxes and wanes, lightheadedness and generalized weakness. On exam alert nontoxic lungs clear auscultation abdomen normal active bowel sounds, nontender, nondistended   Orlie Dakin, MD 01/19/17 2205

## 2017-01-19 NOTE — ED Triage Notes (Signed)
Patient states that she can not hold anything on her stomach. She was here on the 29th and reports that she is not getting any better. The patient reports that she continues to not be able to eat and drink.

## 2017-01-19 NOTE — ED Notes (Signed)
Pt's SpO2 dropping to 85% on R/A. Pt placed on 2L O2 via N/C

## 2017-01-20 ENCOUNTER — Encounter (HOSPITAL_COMMUNITY): Payer: Self-pay

## 2017-01-20 ENCOUNTER — Observation Stay (HOSPITAL_BASED_OUTPATIENT_CLINIC_OR_DEPARTMENT_OTHER): Payer: Federal, State, Local not specified - PPO

## 2017-01-20 DIAGNOSIS — D649 Anemia, unspecified: Secondary | ICD-10-CM

## 2017-01-20 DIAGNOSIS — A09 Infectious gastroenteritis and colitis, unspecified: Secondary | ICD-10-CM | POA: Diagnosis not present

## 2017-01-20 DIAGNOSIS — R062 Wheezing: Secondary | ICD-10-CM | POA: Diagnosis not present

## 2017-01-20 DIAGNOSIS — R197 Diarrhea, unspecified: Secondary | ICD-10-CM | POA: Diagnosis present

## 2017-01-20 DIAGNOSIS — E860 Dehydration: Principal | ICD-10-CM

## 2017-01-20 LAB — CBC
HCT: 30.9 % — ABNORMAL LOW (ref 36.0–46.0)
Hemoglobin: 10 g/dL — ABNORMAL LOW (ref 12.0–15.0)
MCH: 32.6 pg (ref 26.0–34.0)
MCHC: 32.4 g/dL (ref 30.0–36.0)
MCV: 100.7 fL — ABNORMAL HIGH (ref 78.0–100.0)
PLATELETS: 149 10*3/uL — AB (ref 150–400)
RBC: 3.07 MIL/uL — AB (ref 3.87–5.11)
RDW: 14.8 % (ref 11.5–15.5)
WBC: 3.3 10*3/uL — AB (ref 4.0–10.5)

## 2017-01-20 LAB — INFLUENZA PANEL BY PCR (TYPE A & B)
Influenza A By PCR: NEGATIVE
Influenza B By PCR: NEGATIVE

## 2017-01-20 LAB — COMPREHENSIVE METABOLIC PANEL
ALT: 125 U/L — AB (ref 14–54)
ANION GAP: 5 (ref 5–15)
AST: 169 U/L — ABNORMAL HIGH (ref 15–41)
Albumin: 2.8 g/dL — ABNORMAL LOW (ref 3.5–5.0)
Alkaline Phosphatase: 126 U/L (ref 38–126)
BUN: 12 mg/dL (ref 6–20)
CALCIUM: 7.6 mg/dL — AB (ref 8.9–10.3)
CHLORIDE: 109 mmol/L (ref 101–111)
CO2: 23 mmol/L (ref 22–32)
CREATININE: 0.71 mg/dL (ref 0.44–1.00)
Glucose, Bld: 83 mg/dL (ref 65–99)
Potassium: 4.1 mmol/L (ref 3.5–5.1)
SODIUM: 137 mmol/L (ref 135–145)
Total Bilirubin: 0.6 mg/dL (ref 0.3–1.2)
Total Protein: 4.6 g/dL — ABNORMAL LOW (ref 6.5–8.1)

## 2017-01-20 LAB — IRON AND TIBC
Iron: 10 ug/dL — ABNORMAL LOW (ref 28–170)
SATURATION RATIOS: 4 % — AB (ref 10.4–31.8)
TIBC: 234 ug/dL — AB (ref 250–450)
UIBC: 224 ug/dL

## 2017-01-20 LAB — RETICULOCYTES
RBC.: 3.07 MIL/uL — ABNORMAL LOW (ref 3.87–5.11)
Retic Count, Absolute: 46.1 10*3/uL (ref 19.0–186.0)
Retic Ct Pct: 1.5 % (ref 0.4–3.1)

## 2017-01-20 LAB — VITAMIN B12: Vitamin B-12: 1065 pg/mL — ABNORMAL HIGH (ref 180–914)

## 2017-01-20 LAB — FERRITIN: FERRITIN: 123 ng/mL (ref 11–307)

## 2017-01-20 MED ORDER — ONDANSETRON HCL 4 MG/2ML IJ SOLN: 4.0000 mg | Freq: Three times a day (TID) | INTRAMUSCULAR | Status: DC | PRN

## 2017-01-20 MED ORDER — RHO D IMMUNE GLOBULIN 1500 UNIT/2ML IJ SOSY: 300.0000 ug | PREFILLED_SYRINGE | Freq: Once | INTRAMUSCULAR | Status: DC

## 2017-01-20 MED ORDER — KETOROLAC TROMETHAMINE 30 MG/ML IJ SOLN
30.0000 mg | Freq: Four times a day (QID) | INTRAMUSCULAR | Status: DC | PRN
  Administered 2017-01-20: 30 mg via INTRAVENOUS
  Filled 2017-01-20: qty 1

## 2017-01-20 MED ORDER — HYDROMORPHONE HCL 1 MG/ML IJ SOLN
1.0000 mg | INTRAMUSCULAR | Status: DC | PRN
  Administered 2017-01-20: 1 mg via INTRAVENOUS
  Filled 2017-01-20: qty 1

## 2017-01-20 MED ORDER — SODIUM CHLORIDE 0.9 % IV SOLN
INTRAVENOUS | Status: DC
  Administered 2017-01-20 (×2): via INTRAVENOUS

## 2017-01-20 MED ORDER — ONDANSETRON HCL 4 MG PO TABS: 4.0000 mg | ORAL_TABLET | Freq: Four times a day (QID) | ORAL | Status: DC | PRN

## 2017-01-20 MED ORDER — ACETAMINOPHEN 325 MG PO TABS
650.0000 mg | ORAL_TABLET | Freq: Four times a day (QID) | ORAL | Status: DC | PRN
  Administered 2017-01-20 (×2): 650 mg via ORAL
  Filled 2017-01-20 (×2): qty 2

## 2017-01-20 MED ORDER — ENOXAPARIN SODIUM 30 MG/0.3ML ~~LOC~~ SOLN
30.0000 mg | SUBCUTANEOUS | Status: DC
  Administered 2017-01-20: 30 mg via SUBCUTANEOUS
  Filled 2017-01-20: qty 0.3

## 2017-01-20 MED ORDER — ONDANSETRON HCL 4 MG/2ML IJ SOLN
4.0000 mg | Freq: Four times a day (QID) | INTRAMUSCULAR | Status: DC | PRN
  Administered 2017-01-20: 4 mg via INTRAVENOUS
  Filled 2017-01-20: qty 2

## 2017-01-20 MED ORDER — CLONAZEPAM 0.5 MG PO TABS: 0.5000 mg | ORAL_TABLET | Freq: Two times a day (BID) | ORAL | Status: DC | PRN

## 2017-01-20 MED ORDER — HYDROCODONE-ACETAMINOPHEN 5-325 MG PO TABS
1.0000 | ORAL_TABLET | ORAL | Status: DC | PRN
  Administered 2017-01-20 (×4): 2 via ORAL
  Filled 2017-01-20 (×4): qty 2

## 2017-01-20 MED ORDER — ACETAMINOPHEN 650 MG RE SUPP: 650.0000 mg | Freq: Four times a day (QID) | RECTAL | Status: DC | PRN

## 2017-01-20 NOTE — Progress Notes (Signed)
IV Dilaudid given as per order.

## 2017-01-20 NOTE — Progress Notes (Signed)
Patient requesting something for headache, was medicated around 9 for headache, did give Tylenol.  Patient says she has had migraines in the past but is not aware of what she was given that helped with the migraines.  Dr. Doyle Askew notified.

## 2017-01-20 NOTE — H&P (Signed)
History and Physical  Patient Name: Angela Pineda     [id number]    DOB: 1961-03-02    DOA: 01/19/2017 PCP: Ann Held, DO   Patient coming from: Home --> [facility]  Chief Complaint: Diarrhea, malaise  HPI: Angela Pineda is a 56 y.o. female with a past medical history significant for chronic pain daily opioids through [place] who presents with diarrhea.  The patient was in her usual state of health until last weekend.  Her daughters both had a vomiting-diarrhea illness, and she subsequently developed the same symptoms, starting last weekend with vomiting multiple times then followed by diarrhea.  Seen in the ER and there was low suspicion for significant illness, so she was discharged.  Since then, she has had persistent diarrhea, up to 6 times per day, non-bloody.  In the last day or so she has started to feel miserable, can't eat anything without having diarrhea (no more vomiting), has headaches, abdominal cramps, myalgias, malaise.  Came back to the ER tonight.  ED course: -Temp 76F, rate 73, respirations and pulse ox normal, blood pressure 110/36, dropping as low as 79/44 in the ER (review of her previous blood pressures and previous ER visit shows a baseline between 85 and [id number] systolic -Na [id number], K 3.6, Cr 0.61, WBC 7.0, Hgb 11.1 (normocytic)  -Lactic acid normal.   -CT abdomen unremarkable -Got 3L, still appeared dehydrated and with soft BP and uncomfortable going home so [facility] asked to observe overnight     No recent antibiotics.  No history of melena, hematochezia, NSAID use, epistaxis, vaginal bleeding.      ROS: Review of Systems  Constitutional: Positive for chills and malaise/fatigue. Negative for diaphoresis and fever.  HENT: Negative for sore throat.   Respiratory: Negative for cough.   Gastrointestinal: Positive for abdominal pain, diarrhea, nausea and vomiting. Negative for blood in stool and melena.  Genitourinary: Negative for dysuria, frequency, hematuria  and urgency.  Musculoskeletal: Positive for myalgias.  Neurological: Positive for weakness.  All other systems reviewed and are negative.         Past Medical History:  Diagnosis Date  . Bronchitis   . Chronic back pain   . Chronic headaches   . Neck pain, chronic   . Nerve damage   . Panic attacks     Past Surgical History:  Procedure Laterality Date  . ABLATION ON ENDOMETRIOSIS    . KIDNEY SURGERY    . NECK SURGERY  2002    Social History: Patient lives with her husband.  The patient walks unassisted.  She is retired from the [employer].  She smokes.  She is from [place].    Allergies  Allergen Reactions  . Anaprox [Naproxen Sodium] Swelling  . Tramadol Itching    Family history: family history includes Emphysema in her mother; Hypertension in her father; Lung cancer in her mother.  Prior to Admission medications   Medication Sig Start Date End Date Taking? Authorizing Provider  clonazePAM (KLONOPIN) 0.5 MG tablet Take 0.5 mg by mouth 2 (two) times daily as needed for anxiety.    Historical Provider, MD  HYDROcodone-acetaminophen (NORCO/VICODIN) 5-325 MG per tablet Take 1-2 tablets by mouth every 4 (four) hours as needed for moderate pain. 08/23/14   [name], PA-C  ondansetron (ZOFRAN) 4 MG tablet Take 1 tablet (4 mg total) by mouth every 6 (six) hours. 01/14/17   [name], PA-C       Physical Exam: BP Marland Kitchen)  102/54 (BP Location: Left Arm)   Pulse 87   Temp (!) 100.5 F (38.1 C) (Oral)   Resp 18   Ht 5\' 2"  (1.575 m)   Wt 50.6 kg (111 lb 8 oz)   SpO2 94%   BMI 20.39 kg/m  General appearance: Thin adult female, alert and in moderate distress from malaise.   Eyes: Anicteric, conjunctiva pink, lids and lashes normal. PERRL.    ENT: No nasal deformity, discharge, epistaxis.  Hearing normal. OP moist without lesions.   Neck: No neck masses.  Trachea midline.  No thyromegaly/tenderness. Lymph: No cervical or supraclavicular lymphadenopathy. Skin: Warm  and dry.  No jaundice.  No suspicious rashes or lesions. Cardiac: RRR, nl S1-S2, no murmurs appreciated.  Capillary refill is brisk.  JVP normal.  No LE edema.  Radial and DP pulses 2+ and symmetric. Respiratory: Normal respiratory rate and rhythm.  CTAB without rales or wheezes. Abdomen: Abdomen soft.  Nonfocal TTP, mild voluntary guarding. No ascites, distension, hepatosplenomegaly.   MSK: No deformities or effusions.  No cyanosis or clubbing. Neuro: Cranial nerves 3-12 intact.  Sensation intact to light touch. Speech is fluent.  Muscle strength normal.    Psych: Sensorium intact and responding to questions, attention normal.  Behavior appropriate.  Affect normal.  Judgment and insight appear normal.     Labs on Admission:  I have personally reviewed following labs and imaging studies: CBC:  Recent Labs Lab 01/13/17 2143 01/19/17 1752  WBC 5.8 7.0  NEUTROABS 2.3 5.6  HGB 11.5* 11.1*  HCT 34.3* 33.3*  MCV 100.9* 101.2*  PLT 180 0000000   Basic Metabolic Panel:  Recent Labs Lab 01/13/17 2143 01/19/17 1752  NA 139 136  K 3.5 3.6  CL 109 104  CO2 26 27  GLUCOSE 93 85  BUN 14 10  CREATININE 0.67 0.61  CALCIUM 8.8* 8.5*   GFR: Estimated Creatinine Clearance: 61.4 mL/min (by C-G formula based on SCr of 0.61 mg/dL).  Liver Function Tests:  Recent Labs Lab 01/13/17 2143 01/19/17 1752  AST 20 24  ALT 13* 16  ALKPHOS 51 60  BILITOT 0.1* 0.4  PROT 5.8* 5.6*  ALBUMIN 3.6 3.4*    Recent Labs Lab 01/13/17 2143  LIPASE 21   No results for input(s): AMMONIA in the last 168 hours. Coagulation Profile: No results for input(s): INR, PROTIME in the last 168 hours. Cardiac Enzymes: No results for input(s): CKTOTAL, CKMB, CKMBINDEX, TROPONINI in the last 168 hours. BNP (last 3 results) No results for input(s): PROBNP in the last 8760 hours. HbA1C: No results for input(s): HGBA1C in the last 72 hours. CBG: No results for input(s): GLUCAP in the last 168 hours. Lipid  Profile: No results for input(s): CHOL, HDL, LDLCALC, TRIG, CHOLHDL, LDLDIRECT in the last 72 hours. Thyroid Function Tests: No results for input(s): TSH, T4TOTAL, FREET4, T3FREE, THYROIDAB in the last 72 hours. Anemia Panel: No results for input(s): VITAMINB12, FOLATE, FERRITIN, TIBC, IRON, RETICCTPCT in the last 72 hours. Sepsis Labs: Lactic acid normal Invalid input(s): PROCALCITONIN, LACTICIDVEN No results found for this or any previous visit (from the past 240 hour(s)).       Radiological Exams on Admission: Personally reviewed CXR shows no focal opacity; CT abdomen report reviewed: Dg Chest 2 View  Result Date: 01/20/2017 CLINICAL DATA:  Wheezing and desaturations. EXAM: CHEST  2 VIEW COMPARISON:  Lung bases from abdominal 4 hours prior. Radiographs 01/24/2011 FINDINGS: Lungs are hyperinflated. There are bilateral interstitial opacities throughout both lungs, with some  Kerley B-lines present. More patchy opacity at the left lung base. Normal heart size. No pleural fluid or pneumothorax. No acute osseous abnormality. Surgical hardware in the cervical spine is partially included. IMPRESSION: 1. Bilateral interstitial opacities throughout both lungs. This may be pulmonary edema or infectious, pulmonary edema is favored given presence of Kerley B-lines. 2. More patchy opacity at the left lung base, vascular versus atelectasis. Electronically Signed   By: Jeb Levering M.D.   On: 01/20/2017 00:50   Ct Abdomen Pelvis W Contrast  Result Date: 01/19/2017 CLINICAL DATA:  Diffuse abdominal pain and nausea. Seen on January 29 for the same. EXAM: CT ABDOMEN AND PELVIS WITH CONTRAST TECHNIQUE: Multidetector CT imaging of the abdomen and pelvis was performed using the standard protocol following bolus administration of intravenous contrast. CONTRAST:  147mL ISOVUE-300 IOPAMIDOL (ISOVUE-300) INJECTION 61% COMPARISON:  05/31/2012 FINDINGS: Lower chest: No acute abnormality. Mild linear scarring in the  bases. Hepatobiliary: No focal liver abnormality. There is periportal edema, nonspecific. No gallstones, gallbladder wall thickening, or biliary dilatation. Pancreas: Unremarkable. No pancreatic ductal dilatation or surrounding inflammatory changes. Spleen: Normal in size without focal abnormality. Adrenals/Urinary Tract: Adrenal glands are unremarkable. Kidneys are normal, without renal calculi, focal lesion, or hydronephrosis. Bladder is unremarkable. Stomach/Bowel: Stomach is within normal limits. Appendix appears normal. No evidence of bowel wall thickening, distention, or inflammatory changes. Enteric contrast has reached the cecum. Vascular/Lymphatic: The abdominal aorta is normal in caliber with minimal atherosclerotic calcification. No adenopathy. Reproductive: Uterus and bilateral adnexa are unremarkable. Other: No ascites.  No focal inflammation. Musculoskeletal: No significant skeletal lesion. Severe facet arthritis from L4 through the sacrum with mild degenerative spondylolisthesis at L4-5 and L5-S1 IMPRESSION: 1. No acute inflammatory changes in the abdomen or pelvis. 2. Periportal edema in the liver, a nonspecific finding that sometimes merely reflects hydration state. This may be viewed with greater suspicion if there are liver function test abnormalities. Normal gallbladder and bile ducts. 3. Severe facet arthritis in the lower lumbar spine. Electronically Signed   By: Andreas Newport M.D.   On: 01/19/2017 21:41        Assessment/Plan Principal Problem:   Dehydration Active Problems:   Diarrhea   Normocytic anemia  1. Dehydration and hypotension:  She presents with relative hypotension 79 systolic, responsive to fluids to near her baseline [id number] systolic.  Acute diarrheal illnesses considered are Cdiff, norovirus, enterovirus.  Influenza possible, less likely.  Opiate withdrawal considered?     -Check GI pathogen panel and Cdiff (if formed stool, cancel Cdiff) -Fluids -Check resp  virus panel given fever   2. Anemia:  Normocytic.  Appears new in last month or so.  No history to suggest bleeding. -Check iron stores, B12, folate -Check reticulocytes -Follow up with PCP  3. Chronic pain:  Follows with [facility].  [place] controlled substances registry reviewed.  Receives Norco monthly, also clonazepam monthly from her psychiatrist. -Norco PRN  4. Anxiety:  -Continue clonazepam PRN          DVT prophylaxis: Lovenox  Code Status: FULL  Family Communication: None present  Disposition Plan: Anticipate IV fluids this morning, follow stool studies if able.  Likely home this aftenroon or tomorrow when feeling better Consults called: None Admission status: OBS At the point of initial evaluation, it is my clinical opinion that admission for OBSERVATION is reasonable and necessary because the patient's presenting complaints in the context of their chronic conditions represent sufficient risk of deterioration or significant morbidity to constitute reasonable grounds for  close observation in the hospital setting, but that the patient may be medically stable for discharge from the hospital within 24 to 48 hours.    Medical decision making: Patient seen at 3:46 AM on 01/20/2017.  The patient was discussed with Alecia Lemming, PA-C.  What exists of the patient's chart was reviewed in depth and summarized above.  Clinical condition: stable.        Edwin Dada [facility] Pager [phone]

## 2017-01-20 NOTE — Progress Notes (Signed)
Pt seen and examined at bedside, please see earlier admission note by Dr. Loleta Books. Pt admitted for evaluation and management of hypotension in the setting of diarrhea. C. Diff and stool panel pending. Continue IVF, keep on contact precautions. CBC and BMP in AM.  Faye Ramsay, MD  [facility] Pager [phone]  If 7PM-7AM, please contact night-coverage [email or web address] Password [email or web address]

## 2017-01-20 NOTE — Progress Notes (Signed)
Patient reported to RN that headache was back and she needed something to "knock it out".  Notified MD.

## 2017-01-20 NOTE — Progress Notes (Signed)
Patient states headache is much improved.

## 2017-01-20 NOTE — Progress Notes (Signed)
Patient says her headache is gone and she feels much better.  Is requesting to DC, notified Dr. Doyle Askew.

## 2017-01-20 NOTE — Progress Notes (Signed)
Patient still c/o headache, Toradol given.  Will continue to monitor.

## 2017-01-20 NOTE — Progress Notes (Signed)
Discharge instructions reviewed with patient, questions answered, verbalized understanding.  Patient awaiting ride home.

## 2017-01-20 NOTE — Discharge Instructions (Signed)
Diarrhea, Adult °Introduction °Diarrhea is when you have loose and water poop (stool) often. Diarrhea can make you feel weak and cause you to get dehydrated. Dehydration can make you tired and thirsty, make you have a dry mouth, and make it so you pee (urinate) less often. Diarrhea often lasts 2-3 days. However, it can last longer if it is a sign of something more serious. It is important to treat your diarrhea as told by your doctor. °Follow these instructions at home: °Eating and drinking °Follow these recommendations as told by your doctor: °· Take an oral rehydration solution (ORS). This is a drink that is sold at pharmacies and stores. °· Drink clear fluids, such as: °¨ Water. °¨ Ice chips. °¨ Diluted fruit juice. °¨ Low-calorie sports drinks. °· Eat bland, easy-to-digest foods in small amounts as you are able. These foods include: °¨ Bananas. °¨ Applesauce. °¨ Rice. °¨ Low-fat (lean) meats. °¨ Toast. °¨ Crackers. °· Avoid drinking fluids that have a lot of sugar or caffeine in them. °· Avoid alcohol. °· Avoid spicy or fatty foods. °General instructions °· Drink enough fluid to keep your pee (urine) clear or pale yellow. °· Wash your hands often. If you cannot use soap and water, use hand sanitizer. °· Make sure that all people in your home wash their hands well and often. °· Take over-the-counter and prescription medicines only as told by your doctor. °· Rest at home while you get better. °· Watch your condition for any changes. °· Take a warm bath to help with any burning or pain from having diarrhea. °· Keep all follow-up visits as told by your doctor. This is important. °Contact a doctor if: °· You have a fever. °· Your diarrhea gets worse. °· You have new symptoms. °· You cannot keep fluids down. °· You feel light-headed or dizzy. °· You have a headache. °· You have muscle cramps. °Get help right away if: °· You have chest pain. °· You feel very weak or you pass out (faint). °· You have bloody or black  poop or poop that look like tar. °· You have very bad pain, cramping, or bloating in your belly (abdomen). °· You have trouble breathing or you are breathing very quickly. °· Your heart is beating very quickly. °· Your skin feels cold and clammy. °· You feel confused. °· You have signs of dehydration, such as: °¨ Dark pee, hardly any pee, or no pee. °¨ Cracked lips. °¨ Dry mouth. °¨ Sunken eyes. °¨ Sleepiness. °¨ Weakness. °This information is not intended to replace advice given to you by your health care provider. Make sure you discuss any questions you have with your health care provider. °Document Released: 05/21/2008 Document Revised: 06/22/2016 Document Reviewed: 08/09/2015 °© 2017 Elsevier ° °

## 2017-01-20 NOTE — Discharge Summary (Signed)
Physician Discharge Summary  Angela Pineda [id number] DOB: 10-29-1961 DOA: 01/19/2017  PCP: Ann Held, DO  Admit date: 01/19/2017 Discharge date: 01/20/2017  Recommendations for Outpatient Follow-up:  1. Pt will need to follow up with PCP in 1 week post discharge 2. Please obtain BMP to evaluate electrolytes and kidney function 3. Please also check LFT to check on AST and ALT, as pt left prior to these tests being repeated  4. Please note that pt had no abd symptoms, no RUQ pain or discomfort  5. Please also check CBC to evaluate Hg and Hct levels, Plt, WBC 6. Please note that pt wants to go home tonight, CXR findings concerning for pulmonary vascular congestion vs PNA 7. Of pt with worsening fever ofr develops cough, may need to repeat CXR to make sure no impeding PNA 8. Pt left before stool studies are back so this will have to be followed up on  Discharge Diagnoses:  Principal Problem:   Dehydration Active Problems:   Diarrhea   Normocytic anemia  Discharge Condition: Stable  Diet recommendation: Heart healthy diet discussed in details   History of present illness:  56 y.o. female with a past medical history significant for chronic pain and on daily opioids through [facility] who presented with diarrhea.   Hospital Course:  Principal Problem:   Dehydration, diarrhea  - likely volume depletion in the setting of diarrhea - stool studies pending but pt wants to be discharged tonight - advised pt about oral hydration and will have to follow up on stool studies and call pt with results   Active Problems:   Headaches - chronic - per pt, resolved and wants to go home    Pancytopenia - mild, in the setting of acute illness - follow up in an outpatient setting     Transaminitis - normal gallbladder and ducts on abd CT - needs follow up as pt wants to go home     Pulmonary vascular congestion  - on CXR and with ? PNA in the LLL, pt with no cough, no chest  pian and Ct abd caught part of the lower lobes and no PNA evident   - needs close outpatient follow up as she is insisting on going home tonight   Procedures/Studies: Dg Chest 2 View  Result Date: 01/20/2017 CLINICAL DATA:  Wheezing and desaturations. EXAM: CHEST  2 VIEW COMPARISON:  Lung bases from abdominal 4 hours prior. Radiographs 01/24/2011 FINDINGS: Lungs are hyperinflated. There are bilateral interstitial opacities throughout both lungs, with some Kerley B-lines present. More patchy opacity at the left lung base. Normal heart size. No pleural fluid or pneumothorax. No acute osseous abnormality. Surgical hardware in the cervical spine is partially included. IMPRESSION: 1. Bilateral interstitial opacities throughout both lungs. This may be pulmonary edema or infectious, pulmonary edema is favored given presence of Kerley B-lines. 2. More patchy opacity at the left lung base, vascular versus atelectasis. Electronically Signed   By: Jeb Levering M.D.   On: 01/20/2017 00:50   Ct Abdomen Pelvis W Contrast  Result Date: 01/19/2017 CLINICAL DATA:  Diffuse abdominal pain and nausea. Seen on January 29 for the same. EXAM: CT ABDOMEN AND PELVIS WITH CONTRAST TECHNIQUE: Multidetector CT imaging of the abdomen and pelvis was performed using the standard protocol following bolus administration of intravenous contrast. CONTRAST:  12mL ISOVUE-300 IOPAMIDOL (ISOVUE-300) INJECTION 61% COMPARISON:  05/31/2012 FINDINGS: Lower chest: No acute abnormality. Mild linear scarring in the bases. Hepatobiliary: No focal liver  abnormality. There is periportal edema, nonspecific. No gallstones, gallbladder wall thickening, or biliary dilatation. Pancreas: Unremarkable. No pancreatic ductal dilatation or surrounding inflammatory changes. Spleen: Normal in size without focal abnormality. Adrenals/Urinary Tract: Adrenal glands are unremarkable. Kidneys are normal, without renal calculi, focal lesion, or hydronephrosis. Bladder  is unremarkable. Stomach/Bowel: Stomach is within normal limits. Appendix appears normal. No evidence of bowel wall thickening, distention, or inflammatory changes. Enteric contrast has reached the cecum. Vascular/Lymphatic: The abdominal aorta is normal in caliber with minimal atherosclerotic calcification. No adenopathy. Reproductive: Uterus and bilateral adnexa are unremarkable. Other: No ascites.  No focal inflammation. Musculoskeletal: No significant skeletal lesion. Severe facet arthritis from L4 through the sacrum with mild degenerative spondylolisthesis at L4-5 and L5-S1 IMPRESSION: 1. No acute inflammatory changes in the abdomen or pelvis. 2. Periportal edema in the liver, a nonspecific finding that sometimes merely reflects hydration state. This may be viewed with greater suspicion if there are liver function test abnormalities. Normal gallbladder and bile ducts. 3. Severe facet arthritis in the lower lumbar spine. Electronically Signed   By: Andreas Newport M.D.   On: 01/19/2017 21:41     Discharge Exam: Vitals:   01/20/17 1206 01/20/17 1247  BP: (!) 93/57 (!) 90/58  Pulse: 60   Resp: 20   Temp: 98.2 F (36.8 C)    Vitals:   01/20/17 0227 01/20/17 0500 01/20/17 1206 01/20/17 1247  BP: (!) 102/54 134/67 (!) 93/57 (!) 90/58  Pulse:  81 60   Resp: 18 18 20    Temp: (!) 100.5 F (38.1 C) 100 F (37.8 C) 98.2 F (36.8 C)   TempSrc: Oral Oral Oral   SpO2: 94% 95% 95%   Weight: 50.6 kg (111 lb 8 oz)     Height: 5\' 2"  (1.575 m)       General: Pt is alert, follows commands appropriately, not in acute distress Cardiovascular: Regular rate and rhythm, S1/S2 +, no murmurs, no rubs, no gallops Respiratory: Clear to auscultation bilaterally, no wheezing, no crackles, no rhonchi Abdominal: Soft, non tender, non distended, bowel sounds +, no guarding Extremities: no edema, no cyanosis, pulses palpable bilaterally DP and PT Neuro: Grossly nonfocal  Discharge Instructions  Discharge  Instructions    Diet - low sodium heart healthy    Complete by:  As directed    Increase activity slowly    Complete by:  As directed      Allergies as of 01/20/2017      Reactions   Anaprox [naproxen Sodium] Swelling   Tramadol Itching      Medication List    TAKE these medications   clonazePAM 0.5 MG tablet Commonly known as:  KLONOPIN Take 0.5 mg by mouth 2 (two) times daily as needed for anxiety.   HYDROcodone-acetaminophen 5-325 MG tablet Commonly known as:  NORCO/VICODIN Take 1-2 tablets by mouth every 4 (four) hours as needed for moderate pain.   ondansetron 4 MG tablet Commonly known as:  ZOFRAN Take 1 tablet (4 mg total) by mouth every 6 (six) hours. What changed:  when to take this  reasons to take this      [place], DO Follow up.   Specialty:  Family Medicine Contact information: [address] [place] [address] [phone]            The results of significant diagnostics from this hospitalization (including imaging, microbiology, ancillary and laboratory) are listed below for reference.  Microbiology: No results found for this or any previous visit (from the past 240 hour(s)).   Labs: Basic Metabolic Panel:  Recent Labs Lab 01/13/17 2143 01/19/17 1752 01/20/17 0400  NA 139 136 137  K 3.5 3.6 4.1  CL 109 104 109  CO2 26 27 23   GLUCOSE 93 85 83  BUN 14 10 12   CREATININE 0.67 0.61 0.71  CALCIUM 8.8* 8.5* 7.6*   Liver Function Tests:  Recent Labs Lab 01/13/17 2143 01/19/17 1752 01/20/17 0400  AST 20 24 169*  ALT 13* 16 125*  ALKPHOS 51 60 126  BILITOT 0.1* 0.4 0.6  PROT 5.8* 5.6* 4.6*  ALBUMIN 3.6 3.4* 2.8*    Recent Labs Lab 01/13/17 2143  LIPASE 21   No results for input(s): AMMONIA in the last 168 hours. CBC:  Recent Labs Lab 01/13/17 2143 01/19/17 1752 01/20/17 0400  WBC 5.8 7.0 3.3*  NEUTROABS 2.3 5.6  --   HGB 11.5* 11.1* 10.0*  HCT 34.3*  33.3* 30.9*  MCV 100.9* 101.2* 100.7*  PLT 180 175 149*    SIGNED: Time coordinating discharge: 30 minutes  Faye Ramsay, MD  [facility] 01/20/2017, 6:33 PM Pager [phone]  If 7PM-7AM, please contact night-coverage [email or web address] Password [email or web address]

## 2017-01-21 ENCOUNTER — Telehealth: Payer: Self-pay

## 2017-01-21 LAB — FOLATE RBC
FOLATE, HEMOLYSATE: 365.3 ng/mL
Folate, RBC: 1202 ng/mL (ref >498)
HEMATOCRIT: 30.4 % — AB (ref 34.0–46.6)

## 2017-01-21 NOTE — Telephone Encounter (Signed)
Patient scheduled for hospital follow up. 02/07/17 at 5:45 pm with E. Saguier, PA-C

## 2017-01-22 ENCOUNTER — Encounter: Payer: Self-pay | Admitting: Family Medicine

## 2017-01-22 NOTE — Telephone Encounter (Signed)
I don't know why it was changed .  This may need to be sent to the person who changed it.

## 2017-01-23 ENCOUNTER — Other Ambulatory Visit: Payer: Self-pay | Admitting: Family Medicine

## 2017-01-23 DIAGNOSIS — K589 Irritable bowel syndrome without diarrhea: Secondary | ICD-10-CM

## 2017-01-23 MED ORDER — DICYCLOMINE HCL 10 MG PO CAPS: 10.0000 mg | ORAL_CAPSULE | Freq: Three times a day (TID) | ORAL | 0 refills | Status: DC

## 2017-01-23 NOTE — Telephone Encounter (Signed)
Sent to pharmacy 

## 2017-01-25 ENCOUNTER — Ambulatory Visit (INDEPENDENT_AMBULATORY_CARE_PROVIDER_SITE_OTHER): Payer: Federal, State, Local not specified - PPO | Admitting: Medical

## 2017-01-25 ENCOUNTER — Encounter: Payer: Self-pay | Admitting: Medical

## 2017-01-25 ENCOUNTER — Telehealth: Payer: Self-pay

## 2017-01-25 ENCOUNTER — Encounter (HOSPITAL_BASED_OUTPATIENT_CLINIC_OR_DEPARTMENT_OTHER): Payer: Self-pay | Admitting: *Deleted

## 2017-01-25 ENCOUNTER — Emergency Department (HOSPITAL_BASED_OUTPATIENT_CLINIC_OR_DEPARTMENT_OTHER)
Admission: EM | Admit: 2017-01-25 | Discharge: 2017-01-25 | Disposition: A | Discharge status: Home / Self Care | Payer: Federal, State, Local not specified - PPO | Attending: Emergency Medicine | Admitting: Emergency Medicine

## 2017-01-25 VITALS — BP 97/56 | HR 79 | Temp 98.0°F | Resp 16 | Ht 62.0 in | Wt 105.1 lb

## 2017-01-25 DIAGNOSIS — R0989 Other specified symptoms and signs involving the circulatory and respiratory systems: Secondary | ICD-10-CM

## 2017-01-25 DIAGNOSIS — R110 Nausea: Secondary | ICD-10-CM

## 2017-01-25 DIAGNOSIS — R748 Abnormal levels of other serum enzymes: Secondary | ICD-10-CM | POA: Diagnosis not present

## 2017-01-25 DIAGNOSIS — R1030 Lower abdominal pain, unspecified: Secondary | ICD-10-CM | POA: Insufficient documentation

## 2017-01-25 DIAGNOSIS — R109 Unspecified abdominal pain: Secondary | ICD-10-CM | POA: Diagnosis not present

## 2017-01-25 DIAGNOSIS — F17210 Nicotine dependence, cigarettes, uncomplicated: Secondary | ICD-10-CM | POA: Diagnosis not present

## 2017-01-25 DIAGNOSIS — Z79899 Other long term (current) drug therapy: Secondary | ICD-10-CM | POA: Diagnosis not present

## 2017-01-25 DIAGNOSIS — D649 Anemia, unspecified: Secondary | ICD-10-CM | POA: Diagnosis not present

## 2017-01-25 LAB — COMPREHENSIVE METABOLIC PANEL
ALT: 37 U/L — ABNORMAL HIGH (ref 0–35)
ALT: 39 U/L (ref 14–54)
ANION GAP: 6 (ref 5–15)
AST: 20 U/L (ref 0–37)
AST: 25 U/L (ref 15–41)
Albumin: 4 g/dL (ref 3.5–5.2)
Albumin: 4 g/dL (ref 3.5–5.0)
Alkaline Phosphatase: 127 U/L — ABNORMAL HIGH (ref 39–117)
Alkaline Phosphatase: 119 U/L (ref 38–126)
BILIRUBIN TOTAL: 0.3 mg/dL (ref 0.2–1.2)
BILIRUBIN TOTAL: 0.4 mg/dL (ref 0.3–1.2)
BUN: 9 mg/dL (ref 6–23)
BUN: 9 mg/dL (ref 6–20)
CHLORIDE: 106 meq/L (ref 96–112)
CO2: 28 meq/L (ref 19–32)
CO2: 27 mmol/L (ref 22–32)
CREATININE: 0.79 mg/dL (ref 0.40–1.20)
Calcium: 9.1 mg/dL (ref 8.4–10.5)
Calcium: 8.9 mg/dL (ref 8.9–10.3)
Chloride: 105 mmol/L (ref 101–111)
Creatinine, Ser: 0.92 mg/dL (ref 0.44–1.00)
GFR calc Af Amer: >60 mL/min (ref >60)
GFR: 79.5 mL/min (ref >60.00)
GLUCOSE: 78 mg/dL (ref 70–99)
GLUCOSE: 66 mg/dL (ref 65–99)
POTASSIUM: 3.3 mmol/L — AB (ref 3.5–5.1)
Potassium: 4 mEq/L (ref 3.5–5.1)
Sodium: 136 mEq/L (ref 135–145)
Sodium: 138 mmol/L (ref 135–145)
TOTAL PROTEIN: 6.6 g/dL (ref 6.5–8.1)
Total Protein: 6.2 g/dL (ref 6.0–8.3)

## 2017-01-25 LAB — AMYLASE: Amylase: 26 U/L — ABNORMAL LOW (ref 27–131)

## 2017-01-25 LAB — BRAIN NATRIURETIC PEPTIDE: PRO B NATRI PEPTIDE: 73 pg/mL (ref 0.0–100.0)

## 2017-01-25 LAB — CBC WITH DIFFERENTIAL/PLATELET
BASOS ABS: 0.1 10*3/uL (ref 0.0–0.1)
Basophils Relative: 0.9 % (ref 0.0–3.0)
EOS ABS: 0.3 10*3/uL (ref 0.0–0.7)
Eosinophils Relative: 4.3 % (ref 0.0–5.0)
HCT: 37.5 % (ref 36.0–46.0)
Hemoglobin: 12.4 g/dL (ref 12.0–15.0)
LYMPHS ABS: 3.1 10*3/uL (ref 0.7–4.0)
Lymphocytes Relative: 45.6 % (ref 12.0–46.0)
MCHC: 33.2 g/dL (ref 30.0–36.0)
MCV: 100 fl (ref 78.0–100.0)
Monocytes Absolute: 0.4 10*3/uL (ref 0.1–1.0)
Monocytes Relative: 6.2 % (ref 3.0–12.0)
NEUTROS ABS: 2.9 10*3/uL (ref 1.4–7.7)
NEUTROS PCT: 43 % (ref 43.0–77.0)
PLATELETS: 250 10*3/uL (ref 150.0–400.0)
RBC: 3.75 Mil/uL — ABNORMAL LOW (ref 3.87–5.11)
RDW: 15.1 % (ref 11.5–15.5)
WBC: 6.8 10*3/uL (ref 4.0–10.5)

## 2017-01-25 LAB — URINALYSIS, MICROSCOPIC (REFLEX): RBC / HPF: NONE SEEN RBC/hpf (ref 0–5)

## 2017-01-25 LAB — CBC
HEMATOCRIT: 35.8 % — AB (ref 36.0–46.0)
HEMOGLOBIN: 11.9 g/dL — AB (ref 12.0–15.0)
MCH: 33.6 pg (ref 26.0–34.0)
MCHC: 33.2 g/dL (ref 30.0–36.0)
MCV: 101.1 fL — ABNORMAL HIGH (ref 78.0–100.0)
Platelets: 245 10*3/uL (ref 150–400)
RBC: 3.54 MIL/uL — ABNORMAL LOW (ref 3.87–5.11)
RDW: 14.2 % (ref 11.5–15.5)
WBC: 7.1 10*3/uL (ref 4.0–10.5)

## 2017-01-25 LAB — URINALYSIS, ROUTINE W REFLEX MICROSCOPIC
Bilirubin Urine: NEGATIVE
Glucose, UA: NEGATIVE mg/dL
Hgb urine dipstick: NEGATIVE
Ketones, ur: NEGATIVE mg/dL
NITRITE: NEGATIVE
PH: 6.5 (ref 5.0–8.0)
Protein, ur: NEGATIVE mg/dL
SPECIFIC GRAVITY, URINE: 1.017 (ref 1.005–1.030)

## 2017-01-25 LAB — I-STAT CG4 LACTIC ACID, ED: Lactic Acid, Venous: 0.89 mmol/L (ref 0.5–1.9)

## 2017-01-25 LAB — LIPASE: Lipase: 10 U/L — ABNORMAL LOW (ref 11.0–59.0)

## 2017-01-25 LAB — LIPASE, BLOOD: LIPASE: 19 U/L (ref 11–51)

## 2017-01-25 MED ORDER — RANITIDINE HCL 150 MG PO CAPS: 150.0000 mg | ORAL_CAPSULE | Freq: Two times a day (BID) | ORAL | 0 refills | Status: DC

## 2017-01-25 MED ORDER — ONDANSETRON HCL 4 MG/2ML IJ SOLN
4.0000 mg | Freq: Once | INTRAMUSCULAR | Status: AC | PRN
  Administered 2017-01-25: 4 mg via INTRAVENOUS
  Filled 2017-01-25: qty 2

## 2017-01-25 MED ORDER — DICYCLOMINE HCL 10 MG PO CAPS
10.0000 mg | ORAL_CAPSULE | Freq: Once | ORAL | Status: AC
  Administered 2017-01-25: 10 mg via ORAL
  Filled 2017-01-25: qty 1

## 2017-01-25 MED ORDER — SODIUM CHLORIDE 0.9 % IV BOLUS (SEPSIS)
1000.0000 mL | Freq: Once | INTRAVENOUS | Status: AC
  Administered 2017-01-25: 1000 mL via INTRAVENOUS

## 2017-01-25 MED ORDER — ONDANSETRON HCL 4 MG PO TABS: 4.0000 mg | ORAL_TABLET | Freq: Four times a day (QID) | ORAL | 0 refills | Status: DC | PRN

## 2017-01-25 NOTE — ED Notes (Signed)
ED Provider at bedside. 

## 2017-01-25 NOTE — Telephone Encounter (Signed)
Received word from [employer] that patient fell in the lobby.  Pt then went to the lab.  Went to the lab.  Safely escorted patient to an exam room.  Pt stated that she is fine, that she tripped over her granddaughter falling into the chair, bumping her left shoulder.  She denied pain.  Full ROM noted.  Slight redness noted top of left shoulder.  Pt states she's fine otherwise.  Pt was safely escorted back to the lab for remainder of her lab work.   The above was reported to [place].  He advised to clarify whether patient tripped or was lightheaded.  Went back the lab. Pt had already left.    Called patient left a message for call back on her mobile number.

## 2017-01-25 NOTE — Patient Instructions (Addendum)
I recommend ED evaluation now. but you express impossible due child care issue so please get labs done now. If pain worsens then ED evaluation before this evening.  I still recommend ED evaluation later due to severity of abd pain but doing labs now so they can review those later. You may need iv fluids since bp low.   With bp low, anemia recent and 9/10 pain on exam you need to go today(as we approach weekend). Show ED this summary and let them know labs done earlier.  Repeat cxr today.   Rx rantidiine.  rx zofran.  Follow up later with ED.   Note to ED please see prior ct abd report. I am working on getting gi referal early next week.  Follow up to be determined after ED evaluation.

## 2017-01-25 NOTE — ED Provider Notes (Signed)
[facility]PT [facility] Provider Note   CSN: [id number] Arrival date & time: 01/25/17  1704     History   Chief Complaint Chief Complaint  Patient presents with  . Nausea    HPI Angela Pineda is a 56 y.o. female.  The history is provided by the patient. No language interpreter was used.    Angela Pineda is a 56 y.o. female who presents to the Emergency Department complaining of nausea, abdominal pain.  She presents for evaluation of cramping lower abdominal pain with associated nausea. Symptoms started about one week ago. She is admitted to the hospital and discharged home 5 days ago. She denies any fevers, vomiting, dysuria. She has 2-3 BMs daily.  She has had persistent lower abdominal cramping with persistent nausea. She is able to take oral fluids. She reports feeling dizzy when she goes to stand. She takes hydrocodone and Klonopin at home but has not been taking these medications for the last week.  Past Medical History:  Diagnosis Date  . Bronchitis   . Chronic back pain   . Chronic headaches   . Neck pain, chronic   . Nerve damage   . Panic attacks     Patient Active Problem List   Diagnosis Date Noted  . Diarrhea 01/20/2017  . Normocytic anemia 01/20/2017  . Dehydration 01/19/2017  . Radicular low back pain 08/06/2013    Past Surgical History:  Procedure Laterality Date  . ABLATION ON ENDOMETRIOSIS    . KIDNEY SURGERY    . NECK SURGERY  2002    OB History    No data available       Home Medications    Prior to Admission medications   Medication Sig Start Date End Date Taking? Authorizing Provider  clonazePAM (KLONOPIN) 0.5 MG tablet Take 0.5 mg by mouth 2 (two) times daily as needed for anxiety.    Historical Provider, MD  dicyclomine (BENTYL) 10 MG capsule Take 1 capsule (10 mg total) by mouth 3 (three) times daily before meals. 01/23/17   Rosalita Chessman Chase, DO  HYDROcodone-acetaminophen (NORCO/VICODIN) 5-325 MG per tablet Take 1-2 tablets by  mouth every 4 (four) hours as needed for moderate pain. 08/23/14   [name], PA-C  ondansetron (ZOFRAN) 4 MG tablet Take 1 tablet (4 mg total) by mouth every 6 (six) hours as needed for nausea or vomiting. 01/25/17   Percell Miller Saguier, PA-C  ranitidine (ZANTAC) 150 MG capsule Take 1 capsule (150 mg total) by mouth 2 (two) times daily. 01/25/17   Mackie Pai, PA-C    Family History Family History  Problem Relation Age of Onset  . Hypertension Father   . Lung cancer Mother   . Emphysema Mother     Smoker    Social History Social History  Substance Use Topics  . Smoking status: Current Every Day Smoker    Packs/day: 1.00    Types: Cigarettes  . Smokeless tobacco: Never Used  . Alcohol use No     Allergies   Anaprox [naproxen sodium] and Tramadol   Review of Systems Review of Systems  All other systems reviewed and are negative.    Physical Exam Updated Vital Signs BP 95/64   Pulse (!) 52   Temp 98.1 F (36.7 C) (Oral)   Resp 16   Ht 5\' 2"  (1.575 m)   Wt 105 lb (47.6 kg)   SpO2 94%   BMI 19.20 kg/m   Physical Exam  Constitutional: She is oriented to  person, place, and time. She appears well-developed and well-nourished.  HENT:  Head: Normocephalic and atraumatic.  Cardiovascular: Normal rate and regular rhythm.   No murmur heard. Pulmonary/Chest: Effort normal and breath sounds normal. No respiratory distress.  Abdominal: Soft. There is no rebound and no guarding.  Mild diffuse lower abdominal tenderness without any guarding or rebound  Musculoskeletal: She exhibits no edema or tenderness.  Neurological: She is alert and oriented to person, place, and time.  Skin: Skin is warm and dry.  Psychiatric: She has a normal mood and affect. Her behavior is normal.  Nursing note and vitals reviewed.    ED Treatments / Results  Labs (all labs ordered are listed, but only abnormal results are displayed) Labs Reviewed  COMPREHENSIVE METABOLIC PANEL - Abnormal;  Notable for the following:       Result Value   Potassium 3.3 (*)    All other components within normal limits  CBC - Abnormal; Notable for the following:    RBC 3.54 (*)    Hemoglobin 11.9 (*)    HCT 35.8 (*)    MCV 101.1 (*)    All other components within normal limits  URINALYSIS, ROUTINE W REFLEX MICROSCOPIC - Abnormal; Notable for the following:    APPearance CLOUDY (*)    Leukocytes, UA MODERATE (*)    All other components within normal limits  URINALYSIS, MICROSCOPIC (REFLEX) - Abnormal; Notable for the following:    Bacteria, UA RARE (*)    Squamous Epithelial / LPF 0-5 (*)    All other components within normal limits  URINE CULTURE  LIPASE, BLOOD  I-STAT CG4 LACTIC ACID, ED    EKG  EKG Interpretation None       Radiology No results found.  Procedures Procedures (including critical care time)  Medications Ordered in ED Medications  ondansetron (ZOFRAN) injection 4 mg (4 mg Intravenous Given 01/25/17 1816)  sodium chloride 0.9 % bolus 1,000 mL (0 mLs Intravenous Stopped 01/25/17 1859)  dicyclomine (BENTYL) capsule 10 mg (10 mg Oral Given 01/25/17 1858)     Initial Impression / Assessment and Plan / ED Course  I have reviewed the triage vital signs and the nursing notes.  Pertinent labs & imaging results that were available during my care of the patient were reviewed by me and considered in my medical decision making (see chart for details).     Patient here for evaluation of nausea and lower abdominal cramping. She was recently admitted for abdominal pain and hypotension. She states that her blood pressure BUT low. She did have some recorded low blood pressures in the emergency department that when checked with manual cuff she had no hypotension. She has no peritoneal findings on examination and labs are stable compared to priors. Do not feel repeat CT abdomen would be beneficial at this time. Discussed outpatient GI follow-up as well as PCP follow-up. Discussed  home care and outpatient follow-up as well as return precautions. UA does demonstrate some WBCs present, this is present on multiple priors, will send a culture of his current presentation is not consistent with UTI.  Final Clinical Impressions(s) / ED Diagnoses   Final diagnoses:  Nausea  Lower abdominal pain    New Prescriptions New Prescriptions   No medications on file     Quintella Reichert, MD 01/25/17 2135

## 2017-01-25 NOTE — ED Notes (Signed)
Pt verbalizes understanding of d/c instructions and denies any further needs at this time. 

## 2017-01-25 NOTE — ED Triage Notes (Addendum)
Nausea, abdominal pain, dizziness, lightheaded for a week. She was seen by her MD today for same and sent here with hypotension and elevated liver enzymes. Recent hospital admission for same symptoms.

## 2017-01-25 NOTE — ED Notes (Signed)
Pt recently admitted to [facility] for hypotension and abd pain. Pt followed up with PCP today for post admission visit. Pt still c/o abd cramping and noted to be hypotensive at the dr office. Pt reports lower abd cramping, dizziness, nausea without vomiting. Denies cough, SOB, chest pain.

## 2017-01-25 NOTE — Progress Notes (Signed)
Subjective:    Patient ID: Angela Pineda, female    DOB: 05-04-1961, 56 y.o.   MRN: [id number]  HPI  Pt in today for some abdominal cramping sensation. Pain level is about 7/10. Pt also feels light headed. Pt feels very weak. No energy at all. Pt when stands up feel like her balance is off. Pt states no black or blood stools but feels constipated. Pt tends to run low blood pressure. Hx of chronic anemia. Hx of low platelets recently. Pt states 10 yrs or more since colonoscopy. Pt states polpys in past. No hx of diverticolois. Pt thinks had egd done in the past.   Pt had elevated liver enzymes in hospital.   Pt had periportal edema of the liver on ct abd. Report state maybe related to hydration status. But also concern if liver enzymes were elevated. Pt states she does not drink alcohol for 5 years. Pt states just social drinker in past.   Pt cxr show possible opacities on Monday. No coughing. No sov or wheezing.   Pt was found to have low iron in the ED.     Pt states went to hospital for diarrhea and vomiting initially. But no longer has either mild nausea. Recently discharged.  Review of Systems  Constitutional: Positive for fatigue. Negative for chills, diaphoresis and fever.  Respiratory: Negative for cough, choking, shortness of breath and wheezing.   Cardiovascular: Negative for chest pain and palpitations.  Gastrointestinal: Positive for abdominal pain. Negative for abdominal distention, anal bleeding, blood in stool, constipation, diarrhea, nausea, rectal pain and vomiting.       Small stools.  Musculoskeletal: Negative for arthralgias, back pain, joint swelling, myalgias and neck stiffness.  Skin: Negative for rash.  Neurological: Positive for dizziness and weakness. Negative for syncope, facial asymmetry, speech difficulty, light-headedness and numbness.       Fatigue and some weakness on standing.  Hematological: Negative for adenopathy. Does not bruise/bleed easily.    Psychiatric/Behavioral: Negative for behavioral problems, confusion, hallucinations and sleep disturbance. The patient is not nervous/anxious.    Past Medical History:  Diagnosis Date  . Bronchitis   . Chronic back pain   . Chronic headaches   . Neck pain, chronic   . Nerve damage   . Panic attacks      Social History   Social History  . Marital status: Married    Spouse name: N/A  . Number of children: N/A  . Years of education: N/A   Occupational History  . Not on file.   Social History Main Topics  . Smoking status: Current Every Day Smoker    Packs/day: 1.00    Types: Cigarettes  . Smokeless tobacco: Never Used  . Alcohol use No  . Drug use: No  . Sexual activity: Yes    Birth control/ protection: None   Other Topics Concern  . Not on file   Social History Narrative  . No narrative on file    Past Surgical History:  Procedure Laterality Date  . ABLATION ON ENDOMETRIOSIS    . KIDNEY SURGERY    . NECK SURGERY  2002    Family History  Problem Relation Age of Onset  . Hypertension Father   . Lung cancer Mother   . Emphysema Mother     Smoker    Allergies  Allergen Reactions  . Anaprox [Naproxen Sodium] Swelling  . Tramadol Itching    Current Outpatient Prescriptions on File Prior to Visit  Medication Sig  Dispense Refill  . clonazePAM (KLONOPIN) 0.5 MG tablet Take 0.5 mg by mouth 2 (two) times daily as needed for anxiety.    . dicyclomine (BENTYL) 10 MG capsule Take 1 capsule (10 mg total) by mouth 3 (three) times daily before meals. 90 capsule 0  . HYDROcodone-acetaminophen (NORCO/VICODIN) 5-325 MG per tablet Take 1-2 tablets by mouth every 4 (four) hours as needed for moderate pain. 20 tablet 0   No current facility-administered medications on file prior to visit.     BP (!) 97/56 (BP Location: Right Arm, Patient Position: Sitting, Cuff Size: Normal)   Pulse 79   Temp 98 F (36.7 C) (Oral)   Resp 16   Ht 5\' 2"  (1.575 m)   Wt 105 lb 2 oz  (47.7 kg)   SpO2 97%   BMI 19.23 kg/m       Objective:   Physical Exam  General Appearance- Not in acute distress.  HEENT Eyes- Scleraeral/Conjuntiva-bilat- Not Yellow. Mouth & Throat- Normal.  Chest and Lung Exam Auscultation: Breath sounds:-Normal. Adventitious sounds:- No Adventitious sounds.  Cardiovascular Auscultation:Rythm - Regular. Heart Sounds -Normal heart sounds.  Abdomen Inspection:-Inspection Normal.  Palpation/Perucssion: Palpation and Percussion of the abdomen reveal- on palpation left of umbilicus are [id number] Tenderness, No Rebound tenderness, No rigidity(Guarding) and No Palpable abdominal masses.  Liver:-Normal.  Spleen:- Normal.   Rectal Anorectal Exam: Stool - Hemoccult of stool/mucous is Heme Negative. External - normal external exam. Internal - normal sphincter tone. No rectal mass.  Back- no cva      Assessment & Plan:  I recommend ED evaluation now. but you express impossible due child care issue so please get labs done now. If pain worsens then ED evaluation before this evening.  I still recommend ED evaluation later due to severity of abd pain but doing labs now so they can review those later. You may need iv fluids since bp low.   With bp low, anemia recent and 9/10 pain on exam you need to go today(as we approach weekend). Show ED this summary and let them know labs done earlier.  Repeat cxr today.   Rx rantidiine.  rx zofran.  Follow up later with ED.   Note to ED please see prior ct abd report. I am working on getting gi referal early next week.   Pt here with grandaughter she expressed she did not want to go now to ED even though I recommended. I therefore did labs stats to get some information during the interim. Not the way I wanted to proceed.  She tell me will be able to go to ED when daughter or husband gets home.  40 minutes spent with pt today. At least 50% of time counseling on work up, differential dx explanation and  coordinating plan going forward(including urgent gi referral)  Hudsen Fei, Percell Miller, [employer]

## 2017-01-25 NOTE — Progress Notes (Signed)
Pre visit review using our clinic review tool, if applicable. No additional management support is needed unless otherwise documented below in the visit note/SLS  

## 2017-01-26 ENCOUNTER — Encounter: Payer: Self-pay | Admitting: Medical

## 2017-01-26 LAB — ACUTE HEP PANEL AND HEP B SURFACE AB
HCV Ab: NEGATIVE
HEP B S AG: NEGATIVE
Hep A IgM: NONREACTIVE
Hep B C IgM: NONREACTIVE
Hep B S Ab: NEGATIVE

## 2017-01-27 LAB — URINE CULTURE

## 2017-01-28 ENCOUNTER — Encounter: Payer: Self-pay | Admitting: Physician Assistant

## 2017-01-28 ENCOUNTER — Encounter: Payer: Self-pay | Admitting: Medical

## 2017-01-28 LAB — H. PYLORI BREATH TEST: H. PYLORI BREATH TEST: NOT DETECTED

## 2017-01-28 NOTE — Telephone Encounter (Signed)
Copy & Pasted into 01/28/17 My Chart Message and forwarded to provider/SLS 02/12

## 2017-01-28 NOTE — Telephone Encounter (Signed)
Copy & Paste from 01/26/17 My Chart Message/SLS: Message  I went to the ER yesterday around 4:30. I gave them the paper with your notes but they didn't seem all that interested in it. The nurse and doctor never read it. I asked a lot of questions about it but they said the fluid was the cause of the issues. They repeated all of the blood work even after I told them you did it already. They said another CT scan was not necessary. They hooked me up to an IV and gave me a bag of fluid and when it was empty it stayed there bone dry for 3 hours. I'm more confused now than I was before. I kept referring to the notes you wrote for them but I never got any answers other than the fluid on my liver was caused by the fluid I was given. It was a very upsetting experience. I'm still in a lot of pain in my abdomen. They did give me zofran and Bentyl but I was there a long time. They only agreed to give me that because I kept telling them I was in a lot of pain. I really don't know what to do at this point. I will try to get an appointment with the  gastrointerologist but at this point I'm still in a lot of pain and I'm unable to work like this. 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 Other than make that appointment, what should I do?    Theotis Burrow

## 2017-01-29 ENCOUNTER — Encounter: Payer: Self-pay | Admitting: Medical

## 2017-01-29 MED ORDER — OMEPRAZOLE 40 MG PO CPDR: 40.0000 mg | DELAYED_RELEASE_CAPSULE | Freq: Every day | ORAL | 0 refills | Status: AC

## 2017-01-29 MED ORDER — SUCRALFATE 1 G PO TABS: 1.0000 g | ORAL_TABLET | Freq: Three times a day (TID) | ORAL | 0 refills | Status: DC

## 2017-01-29 NOTE — Telephone Encounter (Signed)
Advise pt that with her severe level of abdomen  pain and unable to get out of bed that Dr. Etter Sjogren advised her to go to [facility] ED. They have access to specialist and she can get further work up in light of her severe abdomen pain. Sorry but this is recommended advise. Document patient was advised.

## 2017-01-29 NOTE — Telephone Encounter (Signed)
Advise pt to try prilosec and carafate. Is she taking the zantac?Marland Kitchen When is her gi appointment? Pain severe abdomen unable to stand worrisome description. You can write her work note. Maybe she needs another ED visit(say this tactfully but by her descirption makes me wonder). My half day.  So if needs to be seen tomorrow she needs to be 30 minutres.

## 2017-01-31 ENCOUNTER — Ambulatory Visit: Payer: Self-pay | Admitting: Internal Medicine

## 2017-02-01 ENCOUNTER — Telehealth: Payer: Self-pay

## 2017-02-01 NOTE — Telephone Encounter (Signed)
Work note sent to patient via Pharmacist, community.  Also printed a hard copy if needed and placed up front.  Pt notified and made aware.

## 2017-02-04 ENCOUNTER — Encounter: Payer: Self-pay | Admitting: Physician Assistant

## 2017-02-04 ENCOUNTER — Ambulatory Visit (INDEPENDENT_AMBULATORY_CARE_PROVIDER_SITE_OTHER): Payer: Federal, State, Local not specified - PPO | Admitting: Physician Assistant

## 2017-02-04 VITALS — BP 100/70 | HR 80 | Ht 62.0 in | Wt 100.6 lb

## 2017-02-04 DIAGNOSIS — R109 Unspecified abdominal pain: Secondary | ICD-10-CM

## 2017-02-04 DIAGNOSIS — K219 Gastro-esophageal reflux disease without esophagitis: Secondary | ICD-10-CM | POA: Diagnosis not present

## 2017-02-04 DIAGNOSIS — R197 Diarrhea, unspecified: Secondary | ICD-10-CM

## 2017-02-04 DIAGNOSIS — R110 Nausea: Secondary | ICD-10-CM | POA: Diagnosis not present

## 2017-02-04 DIAGNOSIS — R634 Abnormal weight loss: Secondary | ICD-10-CM

## 2017-02-04 MED ORDER — NA SULFATE-K SULFATE-MG SULF 17.5-3.13-1.6 GM/177ML PO SOLN: 1.0000 | ORAL | 0 refills | Status: DC

## 2017-02-04 MED ORDER — ONDANSETRON HCL 4 MG PO TABS: 4.0000 mg | ORAL_TABLET | Freq: Four times a day (QID) | ORAL | 1 refills | Status: AC | PRN

## 2017-02-04 NOTE — Progress Notes (Signed)
Agree with assessment and plan as outlined. Hopefully she can be booked in the first available opening within the next few weeks for her endoscopies. In the interim, if her pain had localized to the RUQ at all, with her elevated liver enzymes which have since normalized, possible she passed a gallstone. May be reasonable to evaluate for gallstones with a RUQ [place]. If she has had no RUQ pain this would seem less likely however. Overall it appears she is improved on present medication regimen which she should continue until her endoscopies.

## 2017-02-04 NOTE — Progress Notes (Signed)
Chief Complaint: Abdominal pain, nausea, diarrhea, weight loss  HPI:  Angela Pineda is a 56 year old Caucasian female with a past medical history of chronic back pain, chronic headaches, nerve damage and panic attacks as well as others listed below, who was referred to me by Mackie Pai, PA-C for a complaint of abdominal pain, nausea, diarrhea and weight loss.     According to chart review, it appears patient has been to the ED twice and admitted to the hospital once for this abdominal pain and GI symptoms. Patient was initially seen in the ED on 01/13/17 and at that time described diarrhea 6 times a day as well as nausea and vomiting, excessive thirst and generalized abdominal pain and cramping. At that time labs showed hemoglobin minimally decreased at 11.5 and an otherwise normal CBC as well as CMP with an ALT of 13. Lipase was normal. The patient was given fluid and her symptoms improved. She was discharged home on Zofran.     Patient was then admitted to the hospital from 01/19/17-01/20/17, this was for continued symptoms above, she was found to be dehydrated, she also had a mild fever and a transaminitis with a normal gallbladder and ducts on abdominal CT. Iron was found to be low during that stay at 10 with a percent saturation of 4. CBC then showed hemoglobin of 10 with a mild pancytopenia thought due to her dehydration. She had flu testing which was negative. Patient had a CT abdomen pelvis at that time which showed no acute inflammatory changes in the abdomen or pelvis. There was periportal edema in the liver which was nonspecific and thought possibly related to hydration state. There were severe facet arthritis in the lower lumbar spine. Patient requested to leave the next day and testing was not completed fully. Liver enzymes were elevated with AST 169 and ALT 125. (they returned to normal at last draw 01/25/17).   Patient then followed with her PCP on 01/25/17 and had continued abdominal pain and  symptoms and was sent to the ER. While at the ER her CBC showed hemoglobin improved to 11.9, her potassium was low at 3.3. It was not thought that a CT would be beneficial at that time and she was told to follow up with GI as an outpatient.   Today, the patient presents to clinic and tells me that over the past 3-4 days her symptoms have gotten some better. She has been started on a lot of medication including dicyclomine 10 mg every 8 hours, omeprazole 40 mg daily, Zofran 4 mg every 6 hours, Zantac 150 mg twice a day and sucralfate 1 g 4 times daily. The patient tells me that she has just had a decrease in her level of pain from a 10/ 10 down to a 3-4/10 which is described as a constant "cramping" low across her lower abdomen. This is unchanged with a bowel movement. She does tell me her bowel movements continue to be loose but instead of 7-8 a day she has started to have only 2-3 a day. These are very urgent and watery in nature. Patient also describes nausea which is helped by Zofran, but she can "tell when its wearing off". Overall the patient has tried to keep eating, as "forced by my family", but feels lightheaded and dizzy. She reports a 20 pound weight loss over the past month which is confirmed by review of her chart.   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 She is  worried about all the time she has been out of work. She also cares for her grandchildren who were sick with a virus causing vomiting and diarrhea right before the patient started being sick herself.   Patient's medical history is positive for a colonoscopy 10 years ago in [facility] she tells me there is a finding of polyps, we do not have report.   Patient denies continued fever since leaving the hospital, blood in her stool, melena, change in diet, change in medications before symptoms started, shortness of breath, vomiting, heartburn, reflux or symptoms that awaken her at night.  Past Medical History:    Diagnosis Date  . Anemia   . Bronchitis   . Chronic back pain   . Chronic headaches   . Colon polyps    per patient recollection-in [place]  . Neck pain, chronic   . Nerve damage   . Panic attacks     Past Surgical History:  Procedure Laterality Date  . ABLATION ON ENDOMETRIOSIS    . BREAST CYST EXCISION Right   . KIDNEY SURGERY    . NECK SURGERY  2002  . TONSILLECTOMY      Current Outpatient Prescriptions  Medication Sig Dispense Refill  . clonazePAM (KLONOPIN) 0.5 MG tablet Take 0.5 mg by mouth 2 (two) times daily as needed for anxiety.    . dicyclomine (BENTYL) 10 MG capsule Take 1 capsule (10 mg total) by mouth 3 (three) times daily before meals. 90 capsule 0  . omeprazole (PRILOSEC) 40 MG capsule Take 1 capsule (40 mg total) by mouth daily. 30 capsule 0  . ondansetron (ZOFRAN) 4 MG tablet Take 1 tablet (4 mg total) by mouth every 6 (six) hours as needed for nausea or vomiting. 50 tablet 1  . ranitidine (ZANTAC) 150 MG capsule Take 1 capsule (150 mg total) by mouth 2 (two) times daily. 60 capsule 0  . sucralfate (CARAFATE) 1 g tablet Take 1 tablet (1 g total) by mouth 4 (four) times daily -  with meals and at bedtime. 40 tablet 0  . HYDROcodone-acetaminophen (NORCO/VICODIN) 5-325 MG per tablet Take 1-2 tablets by mouth every 4 (four) hours as needed for moderate pain. (Patient not taking: Reported on 02/04/2017) 20 tablet 0  . Na Sulfate-K Sulfate-Mg Sulf 17.5-3.13-1.6 GM/180ML SOLN Take 1 kit by mouth as directed. 354 mL 0   No current facility-administered medications for this visit.     Allergies as of 02/04/2017 - Review Complete 02/04/2017  Allergen Reaction Noted  . Anaprox [naproxen sodium] Swelling 07/16/2011  . Tramadol Itching 07/22/2012    Family History  Problem Relation Age of Onset  . Hypertension Father   . Lung cancer Mother   . Emphysema Mother     Smoker  . Lung cancer Brother   . Liver cancer Maternal Grandmother   . Colon cancer Neg Hx      Social History   Social History  . Marital status: Married    Spouse name: N/A  . Number of children: N/A  . Years of education: N/A   Occupational History  . Not on file.   Social History Main Topics  . Smoking status: Current Every Day Smoker    Packs/day: 1.00    Types: Cigarettes  . Smokeless tobacco: Never Used  . Alcohol use No  . Drug use: No  . Sexual activity: Yes    Birth control/ protection: None   Other Topics Concern  . Not on file   Social History  Narrative  . No narrative on file    Review of Systems:    Constitutional: Positive for weakness and fatigue No weight loss, fever or chills Skin: No rash  Cardiovascular: No chest pain Respiratory: No SOB Gastrointestinal: See HPI and otherwise negative Genitourinary: No dysuria or change in urinary frequency Neurological: Positive for dixxiness Musculoskeletal: No new muscle or joint pain Hematologic: No bleeding  Psychiatric: Positive for panic attacks   Physical Exam:  Vital signs: BP 100/70   Pulse 80   Ht '5\' 2"'  (1.575 m)   Wt 100 lb 9.6 oz (45.6 kg)   BMI 18.40 kg/m   Constitutional:   Pleasant thin, unkempt Caucasian female appears to be in NAD, Well developed, Well nourished, alert and cooperative Head:  Normocephalic and atraumatic. Eyes:   PEERL, EOMI. No icterus. Conjunctiva pink. Ears:  Normal auditory acuity. Neck:  Supple Throat: Oral cavity and pharynx without inflammation, swelling or lesion.  Respiratory: Respirations even and unlabored. Lungs clear to auscultation bilaterally.   No wheezes, crackles, or rhonchi.  Cardiovascular: Normal S1, S2. No MRG. Regular rate and rhythm. No peripheral edema, cyanosis or pallor.  Gastrointestinal:  Soft, nondistended, moderate generalized ttp, with involuntary guarding Normal bowel sounds. No appreciable masses or hepatomegaly. Rectal:  Not performed.  Msk:  Symmetrical without gross deformities. Without edema, no deformity or joint  abnormality.  Neurologic:  Alert and  oriented x4;  grossly normal neurologically.  Skin:   Dry and intact without significant lesions or rashes. Psychiatric: Demonstrates good judgement and reason without abnormal affect or behaviors.  Most recent LABS AND IMAGING: CBC    Component Value Date/Time   WBC 7.1 01/25/2017 1810   RBC 3.54 (L) 01/25/2017 1810   HGB 11.9 (L) 01/25/2017 1810   HCT 35.8 (L) 01/25/2017 1810   HCT 30.4 (L) 01/20/2017 0400   PLT 245 01/25/2017 1810   MCV 101.1 (H) 01/25/2017 1810   MCH 33.6 01/25/2017 1810   MCHC 33.2 01/25/2017 1810   RDW 14.2 01/25/2017 1810   LYMPHSABS 3.1 01/25/2017 1141   MONOABS 0.4 01/25/2017 1141   EOSABS 0.3 01/25/2017 1141   BASOSABS 0.1 01/25/2017 1141    CMP     Component Value Date/Time   NA 138 01/25/2017 1810   K 3.3 (L) 01/25/2017 1810   CL 105 01/25/2017 1810   CO2 27 01/25/2017 1810   GLUCOSE 66 01/25/2017 1810   BUN 9 01/25/2017 1810   CREATININE 0.92 01/25/2017 1810   CALCIUM 8.9 01/25/2017 1810   PROT 6.6 01/25/2017 1810   ALBUMIN 4.0 01/25/2017 1810   AST 25 01/25/2017 1810   ALT 39 01/25/2017 1810   ALKPHOS 119 01/25/2017 1810   BILITOT 0.4 01/25/2017 1810   GFRNONAA >60 01/25/2017 1810   GFRAA >60 01/25/2017 1810   CT ABDOMEN AND PELVIS WITH CONTRAST 01/19/17  TECHNIQUE: Multidetector CT imaging of the abdomen and pelvis was performed using the standard protocol following bolus administration of intravenous contrast.  CONTRAST:  173m ISOVUE-300 IOPAMIDOL (ISOVUE-300) INJECTION 61%  COMPARISON:  05/31/2012  FINDINGS: Lower chest: No acute abnormality. Mild linear scarring in the bases.  Hepatobiliary: No focal liver abnormality. There is periportal edema, nonspecific. No gallstones, gallbladder wall thickening, or biliary dilatation.  Pancreas: Unremarkable. No pancreatic ductal dilatation or surrounding inflammatory changes.  Spleen: Normal in size without focal  abnormality.  Adrenals/Urinary Tract: Adrenal glands are unremarkable. Kidneys are normal, without renal calculi, focal lesion, or hydronephrosis. Bladder is unremarkable.  Stomach/Bowel: Stomach is within  normal limits. Appendix appears normal. No evidence of bowel wall thickening, distention, or inflammatory changes. Enteric contrast has reached the cecum.  Vascular/Lymphatic: The abdominal aorta is normal in caliber with minimal atherosclerotic calcification. No adenopathy.  Reproductive: Uterus and bilateral adnexa are unremarkable.  Other: No ascites.  No focal inflammation.  Musculoskeletal: No significant skeletal lesion. Severe facet arthritis from L4 through the sacrum with mild degenerative spondylolisthesis at L4-5 and L5-S1  IMPRESSION: 1. No acute inflammatory changes in the abdomen or pelvis. 2. Periportal edema in the liver, a nonspecific finding that sometimes merely reflects hydration state. This may be viewed with greater suspicion if there are liver function test abnormalities. Normal gallbladder and bile ducts. 3. Severe facet arthritis in the lower lumbar spine.   Electronically Signed   By: Andreas Newport M.D.   On: 01/19/2017 21:41  Assessment: 1. Lower abdominal cramping: Severe, just recently decreased on the combination of medication she is taking including dicyclomine and antireflux meds as well as Zofran, now 3-4/10 which is constant in nature, CT abdomen pelvis normal recently; consider IBS versus colitis versus other 2. GERD: Controlled now on current medications 3. Nausea: Continues regardless of Zofran 4 mg every 6 hours, though this does help for a little while, consider relation to abdominal pain and diarrhea question viral vs infectious, though this has been occurring now for 4 weeks 4. Diarrhea: With above, apparently stool studies had been ordered in the past but never completed per record review; Consider infectious cause versus  other 5. Weight Loss: 20 pounds in 1 mos, likely due to above  Plan: 1. Discussed with the patient that she has been to the ER and admitted to the hospital on multiple occasions, at this time would recommend we pursue further evaluation with an EGD and colonoscopy. Discussed risks, benefits, limitations and alternatives and the patient agrees to proceed. This was scheduled with Dr. Havery Moros in the [facility], as he had appointments available within the next 3 months and Dr. Fuller Plan, who was the supervising physician did not. 2. Recommend patient continue her medications at this time. Refilled Zofran 4 mg every 4-6 hours #50 with one refill 3. Provided the patient with a work note stating that she could go back to work currently, but that she may need to be out for procedures in the future. 4. Did order stool studies today to include a GI pathogen panel, C. difficile and O&P-if these return positive could consider delaying/canceling EGD and colonoscopy 5. Encouraged patient to continue drinking and eating as this would help with her dizziness and dehydration 6. Recommend patient start once daily iron supplementation 7.  Patient to follow in clinic per Dr. Doyne Keel recommendations after time of procedures  Ellouise Newer, PA-C [facility] 02/04/2017, 3:53 PM  Cc: Mackie Pai, PA-C

## 2017-02-04 NOTE — Patient Instructions (Signed)
You have been scheduled for an endoscopy and colonoscopy. Please follow the written instructions given to you at your visit today. Please pick up your prep supplies at the pharmacy within the next 1-3 days. If you use inhalers (even only as needed), please bring them with you on the day of your procedure. Your physician has requested that you go to www.startemmi.com and enter the access code given to you at your visit today. This web site gives a general overview about your procedure. However, you should still follow specific instructions given to you by our office regarding your preparation for the procedure.  Your physician has requested that you go to the basement for the following lab work before leaving today: stool studies  We have sent the following medications to your pharmacy for you to pick up at your convenience: Zofran 4 mg every 4-6 hrs as needed

## 2017-02-05 DIAGNOSIS — Z79891 Long term (current) use of opiate analgesic: Secondary | ICD-10-CM | POA: Diagnosis not present

## 2017-02-05 DIAGNOSIS — G894 Chronic pain syndrome: Secondary | ICD-10-CM | POA: Diagnosis not present

## 2017-02-07 ENCOUNTER — Inpatient Hospital Stay: Payer: Self-pay | Admitting: Medical

## 2017-02-15 ENCOUNTER — Telehealth: Payer: Self-pay | Admitting: Family Medicine

## 2017-02-15 ENCOUNTER — Telehealth: Payer: Self-pay

## 2017-02-15 NOTE — Telephone Encounter (Signed)
Called left message to call back 

## 2017-02-15 NOTE — Telephone Encounter (Signed)
We would need to have seen her to give her a note

## 2017-02-15 NOTE — Telephone Encounter (Signed)
Patient Name: Angela Pineda DOB: 04-Oct-1961 Initial Comment Caller states she has stomach cramps which came back this morning. MD gave her several medications, but Ventyl helped the most. She is wanting to speak with the nurse about what else to do, and a work note for tonight. Nurse Assessment Nurse: Ardine Bjork, RN, Melissa Date/Time (Eastern Time): 02/15/2017 1:04:46 PM Confirm and document reason for call. If symptomatic, describe symptoms. ---Caller states she has stomach cramps which came back this morning. MD gave her several medications, but Bentyl helped the most. She is wanting to speak with the nurse about what else to do, and a work note for tonight. Abd cramps started 3-4 weeks ago-sxs have improved (thinks improved because stopped taking Bentyl last weekend). Has restarted Bentyl today. Does the patient have any new or worsening symptoms? ---Yes Will a triage be completed? ---Yes Related visit to physician within the last 2 weeks? ---Yes Does the PT have any chronic conditions? (i.e. diabetes, asthma, etc.) ---Yes List chronic conditions. ---Ruling out IBS -has colonoscopy, endoscopy 04-05-17. Is this a behavioral health or substance abuse call? ---No Guidelines Guideline Title Affirmed Question Affirmed Notes Abdominal Pain - Female [1] SEVERE pain (e.g., excruciating) AND [2] present > 1 hour Final Disposition User Go to ED Now Zayas, RN, [place] states she wants a note for work tonight as she may not be able to go to work. Caller refuses ED disposition-states does not want an appt at the office--states she wants a note in case she cannot go into work tonight due to abd pain-had stopped Bentyl as she thought it was making abd pain worse but restarted Bentyl today. Pt was told office will return call to her but unknown when will call-pt verbalizes understanding. Referrals GO TO FACILITY REFUSED Disagree/Comply: Disagree Disagree/Comply Reason: Disagree  with instructions

## 2017-02-15 NOTE — Telephone Encounter (Signed)
Follow up call made to patient regarding complaint of Abdominal pain. Patient states she has colonoscopy scheduled soon and she has had abdominal cramps. Refuses to go to ED because she feels they don't help her.  States she needs a work note. Advised she would not be able to get a work note from this office if she has not been seen. States she will probably go to UC. Patient denies chest pain and shortness of breath.

## 2017-03-11 ENCOUNTER — Other Ambulatory Visit: Payer: Self-pay | Admitting: Family Medicine

## 2017-03-11 DIAGNOSIS — K589 Irritable bowel syndrome without diarrhea: Secondary | ICD-10-CM

## 2017-03-22 ENCOUNTER — Encounter: Payer: Self-pay | Admitting: Gastroenterology

## 2017-04-05 ENCOUNTER — Encounter: Payer: Self-pay | Admitting: Gastroenterology

## 2017-04-05 ENCOUNTER — Ambulatory Visit (AMBULATORY_SURGERY_CENTER): Payer: Federal, State, Local not specified - PPO | Admitting: Gastroenterology

## 2017-04-05 VITALS — BP 104/73 | HR 74 | Temp 99.1°F | Resp 26 | Ht 62.0 in | Wt 100.0 lb

## 2017-04-05 DIAGNOSIS — K219 Gastro-esophageal reflux disease without esophagitis: Secondary | ICD-10-CM

## 2017-04-05 DIAGNOSIS — K2970 Gastritis, unspecified, without bleeding: Secondary | ICD-10-CM

## 2017-04-05 DIAGNOSIS — K6389 Other specified diseases of intestine: Secondary | ICD-10-CM | POA: Diagnosis not present

## 2017-04-05 DIAGNOSIS — R109 Unspecified abdominal pain: Secondary | ICD-10-CM | POA: Diagnosis not present

## 2017-04-05 DIAGNOSIS — K635 Polyp of colon: Secondary | ICD-10-CM

## 2017-04-05 DIAGNOSIS — R112 Nausea with vomiting, unspecified: Secondary | ICD-10-CM

## 2017-04-05 DIAGNOSIS — R197 Diarrhea, unspecified: Secondary | ICD-10-CM | POA: Diagnosis not present

## 2017-04-05 DIAGNOSIS — K3189 Other diseases of stomach and duodenum: Secondary | ICD-10-CM | POA: Diagnosis not present

## 2017-04-05 DIAGNOSIS — D125 Benign neoplasm of sigmoid colon: Secondary | ICD-10-CM

## 2017-04-05 MED ORDER — SODIUM CHLORIDE 0.9 % IV SOLN: 500.0000 mL | INTRAVENOUS | Status: AC

## 2017-04-05 MED ORDER — DICYCLOMINE HCL 10 MG PO CAPS: 10.0000 mg | ORAL_CAPSULE | Freq: Three times a day (TID) | ORAL | 3 refills | Status: DC | PRN

## 2017-04-05 NOTE — Progress Notes (Signed)
Called to room to assist during endoscopic procedure.  Patient ID and intended procedure confirmed with present staff. Received instructions for my participation in the procedure from the performing physician.  

## 2017-04-05 NOTE — Progress Notes (Signed)
To recovery report to Nicki Reaper, Therapist, sports, [facility]

## 2017-04-05 NOTE — Patient Instructions (Addendum)
YOU HAD AN ENDOSCOPIC PROCEDURE TODAY AT [facility]:   Refer to the procedure report that was given to you for any specific questions about what was found during the examination.  If the procedure report does not answer your questions, please call your gastroenterologist to clarify.  If you requested that your care partner not be given the details of your procedure findings, then the procedure report has been included in a sealed envelope for you to review at your convenience later.  YOU SHOULD EXPECT: Some feelings of bloating in the abdomen. Passage of more gas than usual.  Walking can help get rid of the air that was put into your GI tract during the procedure and reduce the bloating. If you had a lower endoscopy (such as a colonoscopy or flexible sigmoidoscopy) you may notice spotting of blood in your stool or on the toilet paper. If you underwent a bowel prep for your procedure, you may not have a normal bowel movement for a few days.  Please Note:  You might notice some irritation and congestion in your nose or some drainage.  This is from the oxygen used during your procedure.  There is no need for concern and it should clear up in a day or so.  SYMPTOMS TO REPORT IMMEDIATELY:   Following lower endoscopy (colonoscopy or flexible sigmoidoscopy):  Excessive amounts of blood in the stool  Significant tenderness or worsening of abdominal pains  Swelling of the abdomen that is new, acute  Fever of 100F or higher   Following upper endoscopy (EGD)  Vomiting of blood or coffee ground material  New chest pain or pain under the shoulder blades  Painful or persistently difficult swallowing  New shortness of breath  Fever of 100F or higher  Black, tarry-looking stools  For urgent or emergent issues, a gastroenterologist can be reached at any hour by calling [phone].   DIET:  We do recommend a small meal at first, but then you may proceed to your regular diet.  Drink  plenty of fluids but you should avoid alcoholic beverages for 24 hours.  ACTIVITY:  You should plan to take it easy for the rest of today and you should NOT DRIVE or use heavy machinery until tomorrow (because of the sedation medicines used during the test).    FOLLOW UP: Our staff will call the number listed on your records the next business day following your procedure to check on you and address any questions or concerns that you may have regarding the information given to you following your procedure. If we do not reach you, we will leave a message.  However, if you are feeling well and you are not experiencing any problems, there is no need to return our call.  We will assume that you have returned to your regular daily activities without incident.  If any biopsies were taken you will be contacted by phone or by letter within the next 1-3 weeks.  Please call us at [phone] if you have not heard about the biopsies in 3 weeks.    SIGNATURES/CONFIDENTIALITY: You and/or your care partner have signed paperwork which will be entered into your electronic medical record.  These signatures attest to the fact that that the information above on your After Visit Summary has been reviewed and is understood.  Full responsibility of the confidentiality of this discharge information lies with you and/or your care-partner.  Gastritis information given. Polyp infornation given No ibuprofen, naproxen or other  non steroidal anti-inflammatory meds for 3 weeks

## 2017-04-05 NOTE — Op Note (Signed)
[facility] Patient Name: Angela Pineda Procedure Date: 04/05/2017 1:32 PM MRN: [id number] Endoscopist: Remo Lipps P. Armbruster MD, MD Age: 56 Referring MD:  Date of Birth: 04-04-1961 Gender: Female Account #: [id number] Procedure:                Upper GI endoscopy Indications:              Diarrhea, Persistent nausea / vomiting of unknown                            cause - symptoms have improved over time Medicines:                Monitored Anesthesia Care Procedure:                Pre-Anesthesia Assessment:                           - Prior to the procedure, a History and Physical                            was performed, and patient medications and                            allergies were reviewed. The patient's tolerance of                            previous anesthesia was also reviewed. The risks                            and benefits of the procedure and the sedation                            options and risks were discussed with the patient.                            All questions were answered, and informed consent                            was obtained. Prior Anticoagulants: The patient has                            taken no previous anticoagulant or antiplatelet                            agents. ASA Grade Assessment: II - A patient with                            mild systemic disease. After reviewing the risks                            and benefits, the patient was deemed in                            satisfactory condition to undergo the procedure.  After obtaining informed consent, the endoscope was                            passed under direct vision. Throughout the                            procedure, the patient's blood pressure, pulse, and                            oxygen saturations were monitored continuously. The                            Endoscope was introduced through the mouth, and                            advanced  to the second part of duodenum. The upper                            GI endoscopy was accomplished without difficulty.                            The patient tolerated the procedure well. Scope In: Scope Out: Findings:                 Esophagogastric landmarks were identified: the                            Z-line was found at 36 cm, the gastroesophageal                            junction was found at 36 cm and the upper extent of                            the gastric folds was found at 36 cm from the                            incisors.                           The exam of the esophagus was otherwise normal.                           Patchy moderate inflammation characterized by                            adherent blood and erythema was found in the                            gastric body and in the gastric antrum. Biopsies                            were taken with a cold forceps for histology.  The exam of the stomach was otherwise normal.                           The duodenal bulb and second portion of the                            duodenum were normal. Biopsies for histology were                            taken with a cold forceps for evaluation of celiac                            disease. Complications:            No immediate complications. Estimated blood loss:                            Minimal. Estimated Blood Loss:     Estimated blood loss was minimal. Impression:               - Esophagogastric landmarks identified.                           - Normal esophagus                           - Gastritis. Biopsied.                           - Normal stomach biopsied.                           - Normal duodenal bulb and second portion of the                            duodenum. Biopsied. Recommendation:           - Patient has a contact number available for                            emergencies. The signs and symptoms of potential                             delayed complications were discussed with the                            patient. Return to normal activities tomorrow.                            Written discharge instructions were provided to the                            patient.                           - Resume previous diet.                           -  Continue present medications.                           - Await pathology results. Remo Lipps P. Armbruster MD, MD 04/05/2017 9:83:38 PM This report has been signed electronically.

## 2017-04-05 NOTE — Op Note (Signed)
[facility] Patient Name: Angela Pineda Procedure Date: 04/05/2017 1:31 PM MRN: [id number] Endoscopist: Remo Lipps P. Vandy Fong MD, MD Age: 56 Referring MD:  Date of Birth: 31-Dec-1960 Gender: Female Account #: [id number] Procedure:                Colonoscopy Indications:              Chronic diarrhea Medicines:                Monitored Anesthesia Care Procedure:                Pre-Anesthesia Assessment:                           - Prior to the procedure, a History and Physical                            was performed, and patient medications and                            allergies were reviewed. The patient's tolerance of                            previous anesthesia was also reviewed. The risks                            and benefits of the procedure and the sedation                            options and risks were discussed with the patient.                            All questions were answered, and informed consent                            was obtained. Prior Anticoagulants: The patient has                            taken no previous anticoagulant or antiplatelet                            agents. ASA Grade Assessment: II - A patient with                            mild systemic disease. After reviewing the risks                            and benefits, the patient was deemed in                            satisfactory condition to undergo the procedure.                           After obtaining informed consent, the colonoscope  was passed under direct vision. Throughout the                            procedure, the patient's blood pressure, pulse, and                            oxygen saturations were monitored continuously. The                            Colonoscope was introduced through the anus and                            advanced to the the terminal ileum, with                            identification of the appendiceal orifice and  IC                            valve. The colonoscopy was performed without                            difficulty. The patient tolerated the procedure                            well. The quality of the bowel preparation was                            good. The terminal ileum, ileocecal valve,                            appendiceal orifice, and rectum were photographed. Scope In: 1:44:30 PM Scope Out: 1:59:15 PM Scope Withdrawal Time: 0 hours 11 minutes 0 seconds  Total Procedure Duration: 0 hours 14 minutes 45 seconds  Findings:                 The perianal and digital rectal examinations were                            normal.                           The terminal ileum appeared normal.                           A 4 mm polyp was found in the sigmoid colon. The                            polyp was flat. The polyp was removed with a cold                            snare. Resection and retrieval were complete.                           The exam was otherwise normal throughout the  examined colon. No inflammatory changes. Small                            rectal vault.                           Biopsies for histology were taken with a cold                            forceps from the right colon, left colon and                            transverse colon for evaluation of microscopic                            colitis. Complications:            No immediate complications. Estimated blood loss:                            Minimal. Estimated Blood Loss:     Estimated blood loss was minimal. Impression:               - The examined portion of the ileum was normal.                           - One 4 mm polyp in the sigmoid colon, removed with                            a cold snare. Resected and retrieved.                           - Normal colon otherwise, no inflammatory changes.                           - Biopsies were taken with a cold forceps from the                             right colon, left colon and transverse colon for                            evaluation of microscopic colitis. Recommendation:           - Patient has a contact number available for                            emergencies. The signs and symptoms of potential                            delayed complications were discussed with the                            patient. Return to normal activities tomorrow.                            Written  discharge instructions were provided to the                            patient.                           - Resume previous diet.                           - Continue present medications.                           - Await pathology results.                           - Repeat colonoscopy is recommended for                            surveillance. The colonoscopy date will be                            determined after pathology results from today's                            exam become available for review.                           - No ibuprofen, naproxen, or other non-steroidal                            anti-inflammatory drugs for 2 weeks after polyp                            removal. Remo Lipps P. Kayah Hecker MD, MD 04/05/2017 2:03:37 PM This report has been signed electronically.

## 2017-04-08 ENCOUNTER — Telehealth: Payer: Self-pay | Admitting: *Deleted

## 2017-04-08 NOTE — Telephone Encounter (Signed)
  Follow up Call-  Call back number 04/05/2017  Post procedure Call Back phone  # [phone]  Permission to leave phone message Yes  Some recent data might be hidden     No answer at # given.  Left message on VM.

## 2017-04-08 NOTE — Telephone Encounter (Signed)
No answer left message and will attempt to call back later this afternoon. Sm 

## 2017-04-10 ENCOUNTER — Telehealth: Payer: Self-pay

## 2017-04-10 DIAGNOSIS — R197 Diarrhea, unspecified: Secondary | ICD-10-CM

## 2017-04-10 NOTE — Telephone Encounter (Signed)
See lab results for additional details.  

## 2017-05-10 ENCOUNTER — Encounter (HOSPITAL_BASED_OUTPATIENT_CLINIC_OR_DEPARTMENT_OTHER): Payer: Self-pay | Admitting: *Deleted

## 2017-05-10 ENCOUNTER — Emergency Department (HOSPITAL_BASED_OUTPATIENT_CLINIC_OR_DEPARTMENT_OTHER)
Admission: EM | Admit: 2017-05-10 | Discharge: 2017-05-10 | Disposition: A | Discharge status: Home / Self Care | Payer: Federal, State, Local not specified - PPO | Attending: Emergency Medicine | Admitting: Emergency Medicine

## 2017-05-10 DIAGNOSIS — Z79899 Other long term (current) drug therapy: Secondary | ICD-10-CM | POA: Diagnosis not present

## 2017-05-10 DIAGNOSIS — H6692 Otitis media, unspecified, left ear: Secondary | ICD-10-CM | POA: Insufficient documentation

## 2017-05-10 DIAGNOSIS — F17210 Nicotine dependence, cigarettes, uncomplicated: Secondary | ICD-10-CM | POA: Insufficient documentation

## 2017-05-10 DIAGNOSIS — H9202 Otalgia, left ear: Secondary | ICD-10-CM | POA: Diagnosis present

## 2017-05-10 MED ORDER — AMOXICILLIN-POT CLAVULANATE 875-125 MG PO TABS: 1.0000 | ORAL_TABLET | Freq: Two times a day (BID) | ORAL | 0 refills | Status: AC

## 2017-05-10 NOTE — ED Triage Notes (Signed)
pt c/o left ear pain x 2 days

## 2017-05-10 NOTE — ED Provider Notes (Signed)
[facility] DEPT Provider Note   CSN: [id number] Arrival date & time: 05/10/17  9562 By signing my name below, I, Janina Mayo and Hansel Feinstein, attest that this documentation has been prepared under the direction and in the presence of Julianne Rice, MD . Electronically Signed: Janina Mayo and Hansel Feinstein, Scribe. Marland Kitchen05/25/18 7:53 PM.   History   Chief Complaint Chief Complaint  Patient presents with  . Otalgia    The history is provided by the patient. No language interpreter was used.   HPI Comments:  Angela Pineda is a 56 y.o. female who presents to the Emergency Department complaining of moderate, "burning" left ear pain that began 2 days ago. Pt denies recent falls, but reports the ear "popped" immediately prior to the onset of her pain. Pt also reports using a Q-tip after the ear popped. She reports associated symptoms of chills and discharge from the ear. No worsening or alleviating factors noted. Pt denies fever.  Past Medical History:  Diagnosis Date  . Anemia   . Arthritis    back,neck  . Bronchitis   . Chronic back pain   . Chronic headaches   . Colon polyps    per patient recollection-in [place]  . Depression   . Neck pain, chronic   . Nerve damage   . Panic attacks     Patient Active Problem List   Diagnosis Date Noted  . Diarrhea 01/20/2017  . Normocytic anemia 01/20/2017  . Dehydration 01/19/2017  . Radicular low back pain 08/06/2013    Past Surgical History:  Procedure Laterality Date  . ABLATION ON ENDOMETRIOSIS    . BREAST CYST EXCISION Right   . COLONOSCOPY    . HEMORRHOID SURGERY    . KIDNEY SURGERY    . NECK SURGERY  2002  . POLYPECTOMY    . TONSILLECTOMY      OB History    No data available       Home Medications    Prior to Admission medications   Medication Sig Start Date End Date Taking? Authorizing Provider  amoxicillin-clavulanate (AUGMENTIN) 875-125 MG tablet Take 1 tablet by mouth every 12 (twelve) hours.  05/10/17   Julianne Rice, MD  clonazePAM (KLONOPIN) 0.5 MG tablet Take 0.5 mg by mouth 2 (two) times daily as needed for anxiety.    [provider]  dicyclomine (BENTYL) 10 MG capsule Take 1 capsule (10 mg total) by mouth 3 (three) times daily before meals. 01/23/17   Ann Held, DO  dicyclomine (BENTYL) 10 MG capsule Take 1 capsule (10 mg total) by mouth every 8 (eight) hours as needed for spasms. 04/05/17   Armbruster, Renelda Loma, MD  HYDROcodone-acetaminophen (NORCO/VICODIN) 5-325 MG per tablet Take 1-2 tablets by mouth every 4 (four) hours as needed for moderate pain. 08/23/14   Hazel Sams, PA-C  omeprazole (PRILOSEC) 40 MG capsule Take 1 capsule (40 mg total) by mouth daily. Patient not taking: Reported on 04/05/2017 01/29/17   Saguier, Percell Miller, PA-C  ondansetron (ZOFRAN) 4 MG tablet Take 1 tablet (4 mg total) by mouth every 6 (six) hours as needed for nausea or vomiting. Patient not taking: Reported on 04/05/2017 02/04/17   Levin Erp, PA    Family History Family History  Problem Relation Age of Onset  . Hypertension Father   . Lung cancer Mother   . Emphysema Mother        Smoker  . Lung cancer Brother   . Liver cancer Maternal Grandmother   .  Colon cancer Neg Hx     Social History Social History  Substance Use Topics  . Smoking status: Current Every Day Smoker    Packs/day: 1.00    Types: Cigarettes  . Smokeless tobacco: Never Used  . Alcohol use No     Allergies   Anaprox [naproxen sodium] and Tramadol   Review of Systems Review of Systems  Constitutional: Negative for chills and fever.  HENT: Positive for ear discharge and ear pain. Negative for congestion, sinus pain, sinus pressure and sore throat.   Respiratory: Negative for cough and shortness of breath.   Cardiovascular: Negative for chest pain, palpitations and leg swelling.  Gastrointestinal: Negative for abdominal pain, diarrhea, nausea and vomiting.  Genitourinary: Negative  for dysuria and frequency.  Musculoskeletal: Negative for back pain, myalgias and neck pain.  Skin: Negative for rash and wound.  Neurological: Negative for dizziness, weakness, light-headedness, numbness and headaches.  All other systems reviewed and are negative.    Physical Exam Updated Vital Signs BP 114/82 (BP Location: Left Arm)   Pulse 61   Temp 98.3 F (36.8 C) (Oral)   Resp 16   Ht 5\' 2"  (1.575 m)   Wt 54.4 kg (120 lb)   SpO2 100%   BMI 21.95 kg/m   Physical Exam  Constitutional: She is oriented to person, place, and time. She appears well-developed and well-nourished.  HENT:  Head: Normocephalic and atraumatic.  Mouth/Throat: Oropharynx is clear and moist.  Left TM is intact. No evidence of trauma. TM is bulging and erythematous.  Eyes: EOM are normal. Pupils are equal, round, and reactive to light.  Neck: Normal range of motion. Neck supple.  No meningismus  Cardiovascular: Normal rate and regular rhythm.  Exam reveals no gallop and no friction rub.   No murmur heard. Pulmonary/Chest: Effort normal and breath sounds normal.  Abdominal: Soft. Bowel sounds are normal. There is no tenderness. There is no rebound and no guarding.  Musculoskeletal: Normal range of motion. She exhibits no edema or tenderness.  Lymphadenopathy:    She has no cervical adenopathy.  Neurological: She is alert and oriented to person, place, and time.  Skin: Skin is warm and dry. Capillary refill takes less than 2 seconds. No rash noted. No erythema.  Psychiatric: She has a normal mood and affect. Her behavior is normal.  Nursing note and vitals reviewed.    ED Treatments / Results  DIAGNOSTIC STUDIES:  Oxygen Saturation is 100% on RA, normal by my interpretation.    COORDINATION OF CARE:  7:48 PM Discussed treatment plan with pt at bedside and pt agreed to plan.  Labs (all labs ordered are listed, but only abnormal results are displayed) Labs Reviewed - No data to  display  EKG  EKG Interpretation None       Radiology No results found.  Procedures Procedures (including critical care time)  Medications Ordered in ED Medications - No data to display   Initial Impression / Assessment and Plan / ED Course  I have reviewed the triage vital signs and the nursing notes.  Pertinent labs & imaging results that were available during my care of the patient were reviewed by me and considered in my medical decision making (see chart for details).     Will treat for acute otitis media. Return precautions given.  Final Clinical Impressions(s) / ED Diagnoses   Final diagnoses:  Acute otitis media, left    New Prescriptions Discharge Medication List as of 05/10/2017  8:02 PM  START taking these medications   Details  amoxicillin-clavulanate (AUGMENTIN) 875-125 MG tablet Take 1 tablet by mouth every 12 (twelve) hours., Starting Fri 05/10/2017, Print      I personally performed the services described in this documentation, which was scribed in my presence. The recorded information has been reviewed and is accurate.       Julianne Rice, MD 05/13/17 2120

## 2017-05-10 NOTE — ED Notes (Signed)
Pt verbalizes understanding of d/c instructions and denies any further needs at this time. 

## 2017-06-29 DIAGNOSIS — S134XXS Sprain of ligaments of cervical spine, sequela: Secondary | ICD-10-CM | POA: Diagnosis not present

## 2017-06-29 DIAGNOSIS — G894 Chronic pain syndrome: Secondary | ICD-10-CM | POA: Diagnosis not present

## 2017-06-29 DIAGNOSIS — M5136 Other intervertebral disc degeneration, lumbar region: Secondary | ICD-10-CM | POA: Diagnosis not present

## 2017-08-05 DIAGNOSIS — F410 Panic disorder [episodic paroxysmal anxiety] without agoraphobia: Secondary | ICD-10-CM | POA: Diagnosis not present

## 2017-08-05 DIAGNOSIS — F3342 Major depressive disorder, recurrent, in full remission: Secondary | ICD-10-CM | POA: Diagnosis not present

## 2017-08-12 DIAGNOSIS — F17210 Nicotine dependence, cigarettes, uncomplicated: Secondary | ICD-10-CM | POA: Diagnosis not present

## 2017-08-12 DIAGNOSIS — M5432 Sciatica, left side: Secondary | ICD-10-CM | POA: Diagnosis not present

## 2017-08-12 DIAGNOSIS — R252 Cramp and spasm: Secondary | ICD-10-CM | POA: Diagnosis not present

## 2017-08-12 DIAGNOSIS — X500XXA Overexertion from strenuous movement or load, initial encounter: Secondary | ICD-10-CM | POA: Diagnosis not present

## 2017-08-12 DIAGNOSIS — G8929 Other chronic pain: Secondary | ICD-10-CM | POA: Diagnosis not present

## 2017-08-12 DIAGNOSIS — M545 Low back pain: Secondary | ICD-10-CM | POA: Diagnosis not present

## 2017-09-25 ENCOUNTER — Other Ambulatory Visit: Payer: Self-pay | Admitting: Gastroenterology

## 2017-09-25 DIAGNOSIS — R197 Diarrhea, unspecified: Secondary | ICD-10-CM

## 2017-09-25 DIAGNOSIS — K219 Gastro-esophageal reflux disease without esophagitis: Secondary | ICD-10-CM

## 2017-09-25 DIAGNOSIS — D125 Benign neoplasm of sigmoid colon: Secondary | ICD-10-CM

## 2017-09-25 DIAGNOSIS — R109 Unspecified abdominal pain: Secondary | ICD-10-CM

## 2017-11-29 DIAGNOSIS — Z79891 Long term (current) use of opiate analgesic: Secondary | ICD-10-CM | POA: Diagnosis not present

## 2017-11-29 DIAGNOSIS — M5136 Other intervertebral disc degeneration, lumbar region: Secondary | ICD-10-CM | POA: Diagnosis not present

## 2017-12-08 ENCOUNTER — Encounter (HOSPITAL_BASED_OUTPATIENT_CLINIC_OR_DEPARTMENT_OTHER): Payer: Self-pay

## 2017-12-08 ENCOUNTER — Emergency Department (HOSPITAL_BASED_OUTPATIENT_CLINIC_OR_DEPARTMENT_OTHER): Payer: Federal, State, Local not specified - PPO

## 2017-12-08 ENCOUNTER — Emergency Department (HOSPITAL_BASED_OUTPATIENT_CLINIC_OR_DEPARTMENT_OTHER)
Admission: EM | Admit: 2017-12-08 | Discharge: 2017-12-09 | Disposition: A | Discharge status: Home / Self Care | Payer: Federal, State, Local not specified - PPO | Attending: Emergency Medicine | Admitting: Emergency Medicine

## 2017-12-08 ENCOUNTER — Other Ambulatory Visit: Payer: Self-pay

## 2017-12-08 DIAGNOSIS — G8929 Other chronic pain: Secondary | ICD-10-CM | POA: Diagnosis not present

## 2017-12-08 DIAGNOSIS — F17210 Nicotine dependence, cigarettes, uncomplicated: Secondary | ICD-10-CM | POA: Insufficient documentation

## 2017-12-08 DIAGNOSIS — M545 Low back pain: Secondary | ICD-10-CM | POA: Diagnosis not present

## 2017-12-08 DIAGNOSIS — M5442 Lumbago with sciatica, left side: Secondary | ICD-10-CM | POA: Insufficient documentation

## 2017-12-08 DIAGNOSIS — M5441 Lumbago with sciatica, right side: Secondary | ICD-10-CM | POA: Diagnosis not present

## 2017-12-08 DIAGNOSIS — Z79899 Other long term (current) drug therapy: Secondary | ICD-10-CM | POA: Diagnosis not present

## 2017-12-08 MED ORDER — OXYCODONE-ACETAMINOPHEN 5-325 MG PO TABS
1.0000 | ORAL_TABLET | Freq: Once | ORAL | Status: AC
  Administered 2017-12-08: 1 via ORAL
  Filled 2017-12-08: qty 1

## 2017-12-08 MED ORDER — CYCLOBENZAPRINE HCL 10 MG PO TABS: 10.0000 mg | ORAL_TABLET | Freq: Two times a day (BID) | ORAL | 0 refills | Status: AC | PRN

## 2017-12-08 MED ORDER — METHYLPREDNISOLONE 4 MG PO TBPK: ORAL_TABLET | ORAL | 0 refills | Status: AC

## 2017-12-08 NOTE — ED Triage Notes (Signed)
Pt reports lower back pain that began 1 week ago with gradual worsening. Denies known injury. Heavy lifting at work. Radiation down bilateral buttocks. Denies incontinence or dysuria.

## 2017-12-08 NOTE — ED Notes (Signed)
Patient presents stating she has been having more pain to her lower back.  States she works for the [employer] and the are on mandatory 12 hour days 7 days a week at this time.   States she has been alternating her Norco (for an injury to her neck) and ibuprofen but nothing seems to be touching the pain.  States she has not been having trouble urinating.

## 2017-12-08 NOTE — ED Notes (Signed)
Patient transported to X-ray 

## 2017-12-08 NOTE — Discharge Instructions (Signed)
Workup has been normal. Please take medications as prescribed and instructed.  Take the steroids to help with inflammation.  Take the Flexeril to help with muscle spasms.  May use Motrin and Tylenol at home.  Follow-up with your spine doctor or primary care doctor.  SEEK IMMEDIATE MEDICAL ATTENTION IF: New numbness, tingling, weakness, or problem with the use of your arms or legs.  Severe back pain not relieved with medications.  Change in bowel or bladder control.  Urinary retention.  Numbness in your groin.  Increasing pain in any areas of the body (such as chest or abdominal pain).  Shortness of breath, dizziness or fainting.  Nausea (feeling sick to your stomach), vomiting, fever, or sweats.

## 2017-12-09 NOTE — ED Provider Notes (Signed)
[facility] EMERGENCY DEPARTMENT Provider Note   CSN: [id number] Arrival date & time: 12/08/17  2142     History   Chief Complaint Chief Complaint  Patient presents with  . Back Pain    HPI Angela Pineda is a 56 y.o. female.  HPI 56 year old Caucasian female past medical history significant for chronic back pain presents to the emergency department today with complaints of acute on chronic low back pain.  Patient states that her symptoms began 1 week ago but gradually worsened 2 days ago.  States that she does a lot of heavy lifting at work at the [employer] and has been working long hours this week.  She feels that this is aggravating her symptoms.  States that it is in her lower back and radiates down both bilateral buttocks.  Denies any urinary symptoms.  Patient states that she does have Norco at home that she takes and took a 10 mg Norco today without any pain relief.  Patient has known cervical and lumbar stenosis. Pt denies any ha, night sweats, hx of ivdu/cancer, loss or bowel or bladder, urinary retention, saddle paresthesias, lower extremity paresthesias.  Patient denies any dysuria, hematuria, urinary urgency or frequency.  Nothing makes her symptoms better or worse. Past Medical History:  Diagnosis Date  . Anemia   . Arthritis    back,neck  . Bronchitis   . Chronic back pain   . Chronic headaches   . Colon polyps    per patient recollection-in [place]  . Depression   . Neck pain, chronic   . Nerve damage   . Panic attacks     Patient Active Problem List   Diagnosis Date Noted  . Diarrhea 01/20/2017  . Normocytic anemia 01/20/2017  . Dehydration 01/19/2017  . Radicular low back pain 08/06/2013    Past Surgical History:  Procedure Laterality Date  . ABLATION ON ENDOMETRIOSIS    . BREAST CYST EXCISION Right   . COLONOSCOPY    . HEMORRHOID SURGERY    . KIDNEY SURGERY    . NECK SURGERY  2002  . POLYPECTOMY    . TONSILLECTOMY      OB  History    No data available       Home Medications    Prior to Admission medications   Medication Sig Start Date End Date Taking? Authorizing Provider  clonazePAM (KLONOPIN) 0.5 MG tablet Take 0.5 mg by mouth 2 (two) times daily as needed for anxiety.   Yes [provider]  dicyclomine (BENTYL) 10 MG capsule Take 1 capsule (10 mg total) by mouth 3 (three) times daily before meals. 01/23/17  Yes Roma Schanz R, DO  dicyclomine (BENTYL) 10 MG capsule TAKE 1 CAPSULE(10 MG) BY MOUTH EVERY 8 HOURS AS NEEDED FOR SPASMS 09/25/17  Yes Armbruster, Carlota Raspberry, MD  HYDROcodone-acetaminophen (NORCO/VICODIN) 5-325 MG per tablet Take 1-2 tablets by mouth every 4 (four) hours as needed for moderate pain. 08/23/14  Yes Dammen, Collier Salina, PA-C  amoxicillin-clavulanate (AUGMENTIN) 875-125 MG tablet Take 1 tablet by mouth every 12 (twelve) hours. 05/10/17   Julianne Rice, MD  cyclobenzaprine (FLEXERIL) 10 MG tablet Take 1 tablet (10 mg total) by mouth 2 (two) times daily as needed for muscle spasms. 12/08/17   Doristine Devoid, PA-C  methylPREDNISolone (MEDROL DOSEPAK) 4 MG TBPK tablet Take as directed 12/08/17   Ocie Cornfield T, PA-C  omeprazole (PRILOSEC) 40 MG capsule Take 1 capsule (40 mg total) by mouth daily. Patient not  taking: Reported on 04/05/2017 01/29/17   Saguier, Percell Miller, PA-C  ondansetron (ZOFRAN) 4 MG tablet Take 1 tablet (4 mg total) by mouth every 6 (six) hours as needed for nausea or vomiting. Patient not taking: Reported on 04/05/2017 02/04/17   Levin Erp, PA    Family History Family History  Problem Relation Age of Onset  . Hypertension Father   . Lung cancer Mother   . Emphysema Mother        Smoker  . Lung cancer Brother   . Liver cancer Maternal Grandmother   . Colon cancer Neg Hx     Social History Social History   Tobacco Use  . Smoking status: Current Every Day Smoker    Packs/day: 1.00    Types: Cigarettes  . Smokeless tobacco: Never Used    Substance Use Topics  . Alcohol use: No  . Drug use: No     Allergies   Anaprox [naproxen sodium] and Tramadol   Review of Systems Review of Systems  Gastrointestinal: Negative for nausea and vomiting.  Genitourinary: Negative for dysuria, flank pain, frequency, hematuria and urgency.  Musculoskeletal: Positive for arthralgias and back pain. Negative for gait problem, joint swelling and myalgias.  Skin: Negative for color change and wound.  Neurological: Negative for weakness and numbness.     Physical Exam Updated Vital Signs BP 110/68 (BP Location: Right Arm)   Pulse 80   Temp 98.1 F (36.7 C) (Oral)   Resp 20   Ht 5\' 2"  (1.575 m)   Wt 45.4 kg (100 lb)   SpO2 100%   BMI 18.29 kg/m   Physical Exam  Constitutional: She appears well-developed and well-nourished. No distress.  HENT:  Head: Normocephalic and atraumatic.  Eyes: Right eye exhibits no discharge. Left eye exhibits no discharge. No scleral icterus.  Neck: Normal range of motion.  Pulmonary/Chest: No respiratory distress.  Musculoskeletal: Normal range of motion.  No midline T spine.  Midline L spine tenderness noted with paraspinal tenderness that radiates to the bilateral buttocks.  Positive straight leg raise test bilaterally.  No deformities or step offs noted. Full ROM. Pelvis is stable.  Brisk cap refill.  DP pulses 2+ bilaterally.  Neurological: She is alert.  Sensation intact.  5 out of 5 in lower extremities.  Skin: Skin is warm and dry. Capillary refill takes less than 2 seconds. No pallor.  Psychiatric: Her behavior is normal. Judgment and thought content normal.  Nursing note and vitals reviewed.    ED Treatments / Results  Labs (all labs ordered are listed, but only abnormal results are displayed) Labs Reviewed - No data to display  EKG  EKG Interpretation None       Radiology Dg Lumbar Spine Complete  Result Date: 12/08/2017 CLINICAL DATA:  56 year old female with back pain.   No injury. EXAM: LUMBAR SPINE - COMPLETE 4+ VIEW COMPARISON:  Abdominal CT dated 01/19/2017 FINDINGS: There is no acute fracture or subluxation of the lumbar spine. The vertebral body heights and disc spaces are maintained. The visualized posterior elements appear intact. There is lower lumbar facet degeneration. There is grade 1 L5-S1 anterolisthesis as seen on the prior CT. The soft tissues appear unremarkable. IMPRESSION: 1. No acute lumbar spine pathology. 2. Lower lumbar facet degeneration and Grade 1 L5-S1 anterolisthesis similar to prior CT. Electronically Signed   By: Anner Crete M.D.   On: 12/08/2017 23:44    Procedures Procedures (including critical care time)  Medications Ordered in ED Medications  oxyCODONE-acetaminophen (PERCOCET/ROXICET) 5-325 MG per tablet 1 tablet (1 tablet Oral Given 12/08/17 2318)     Initial Impression / Assessment and Plan / ED Course  I have reviewed the triage vital signs and the nursing notes.  Pertinent labs & imaging results that were available during my care of the patient were reviewed by me and considered in my medical decision making (see chart for details).     Patient with back pain.  No neurological deficits and normal neuro exam.  This is acute on chronic low back pain.  She does have a narcotic medicines at home.  Patient is requesting IM narcotic medicines in the ED.  However when I go to assess patient she is sound asleep on the bed.  Patient can walk but states is painful.  No loss of bowel or bladder control.  No concern for cauda equina.  X-ray was performed that showed no acute abnormalities.  No urinary symptoms.  Doubt pyelonephritis or UTI.  No fever, night sweats, weight loss, h/o cancer, IVDU.  RICE protocol and pain medicine indicated and discussed with patient.  Gust with patient we will give steroid taper pack and some Flexeril.  Needs follow-up with her primary care doctor.  Pt is hemodynamically stable, in NAD, & able to  ambulate in the ED. Evaluation does not show pathology that would require ongoing emergent intervention or inpatient treatment. I explained the diagnosis to the patient. Pain has been managed & has no complaints prior to dc. Pt is comfortable with above plan and is stable for discharge at this time. All questions were answered prior to disposition. Strict return precautions for f/u to the ED were discussed. Encouraged follow up with PCP.    Final Clinical Impressions(s) / ED Diagnoses   Final diagnoses:  Chronic bilateral low back pain with bilateral sciatica    ED Discharge Orders        Ordered    cyclobenzaprine (FLEXERIL) 10 MG tablet  2 times daily PRN     12/08/17 2354    methylPREDNISolone (MEDROL DOSEPAK) 4 MG TBPK tablet     12/08/17 2354       Doristine Devoid, PA-C 12/09/17 0200    Margette Fast, MD 12/09/17 [id number]

## 2017-12-09 NOTE — ED Notes (Signed)
Pt discharged to home with family. NAD.  

## 2017-12-12 ENCOUNTER — Other Ambulatory Visit: Payer: Self-pay

## 2017-12-12 ENCOUNTER — Encounter (HOSPITAL_BASED_OUTPATIENT_CLINIC_OR_DEPARTMENT_OTHER): Payer: Self-pay

## 2017-12-12 ENCOUNTER — Emergency Department (HOSPITAL_BASED_OUTPATIENT_CLINIC_OR_DEPARTMENT_OTHER)
Admission: EM | Admit: 2017-12-12 | Discharge: 2017-12-12 | Disposition: A | Discharge status: Home / Self Care | Payer: Federal, State, Local not specified - PPO | Attending: Physician Assistant | Admitting: Physician Assistant

## 2017-12-12 DIAGNOSIS — F17210 Nicotine dependence, cigarettes, uncomplicated: Secondary | ICD-10-CM | POA: Insufficient documentation

## 2017-12-12 DIAGNOSIS — H40211 Acute angle-closure glaucoma, right eye: Secondary | ICD-10-CM | POA: Diagnosis not present

## 2017-12-12 DIAGNOSIS — Z79899 Other long term (current) drug therapy: Secondary | ICD-10-CM | POA: Insufficient documentation

## 2017-12-12 DIAGNOSIS — H5711 Ocular pain, right eye: Secondary | ICD-10-CM | POA: Diagnosis not present

## 2017-12-12 DIAGNOSIS — F329 Major depressive disorder, single episode, unspecified: Secondary | ICD-10-CM | POA: Diagnosis not present

## 2017-12-12 MED ORDER — TETRACAINE HCL 0.5 % OP SOLN
2.0000 [drp] | Freq: Once | OPHTHALMIC | Status: AC
  Administered 2017-12-12: 2 [drp] via OPHTHALMIC
  Filled 2017-12-12: qty 4

## 2017-12-12 MED ORDER — DORZOLAMIDE HCL-TIMOLOL MAL 2-0.5 % OP SOLN: 1.0000 [drp] | Freq: Two times a day (BID) | OPHTHALMIC | 12 refills | Status: AC

## 2017-12-12 MED ORDER — OXYCODONE-ACETAMINOPHEN 5-325 MG PO TABS
1.0000 | ORAL_TABLET | Freq: Once | ORAL | Status: AC
  Administered 2017-12-12: 1 via ORAL
  Filled 2017-12-12: qty 1

## 2017-12-12 MED ORDER — FLUORESCEIN SODIUM 1 MG OP STRP
1.0000 | ORAL_STRIP | Freq: Once | OPHTHALMIC | Status: AC
  Administered 2017-12-12: 1 via OPHTHALMIC
  Filled 2017-12-12: qty 1

## 2017-12-12 MED ORDER — DORZOLAMIDE HCL-TIMOLOL MAL 2-0.5 % OP SOLN
1.0000 [drp] | Freq: Two times a day (BID) | OPHTHALMIC | Status: DC
  Filled 2017-12-12: qty 10

## 2017-12-12 MED ORDER — BRIMONIDINE TARTRATE 0.15 % OP SOLN: 1.0000 [drp] | Freq: Three times a day (TID) | OPHTHALMIC | 12 refills | Status: AC

## 2017-12-12 MED ORDER — POLYMYXIN B-TRIMETHOPRIM 10000-0.1 UNIT/ML-% OP SOLN
2.0000 [drp] | OPHTHALMIC | Status: DC
  Administered 2017-12-12: 2 [drp] via OPHTHALMIC
  Filled 2017-12-12: qty 10

## 2017-12-12 MED ORDER — BRIMONIDINE TARTRATE 0.15 % OP SOLN
1.0000 [drp] | Freq: Once | OPHTHALMIC | Status: DC
  Filled 2017-12-12: qty 5

## 2017-12-12 NOTE — Discharge Instructions (Signed)
The eye drops that we gave you in the ED what you need to use 3 times daily.  Otherwise all the instructions for the eyedrops are on the containers.  Is very very very important that you use these eyedrops.  You have potential to lose complete vision in your eye.  Need to follow-up tomorrow morning,  call Dr. Velvet Bathe office first thing in the morning

## 2017-12-12 NOTE — ED Notes (Signed)
Pt verbalized understanding of need to fill eye drops scripts tonight and call eye MD in am

## 2017-12-12 NOTE — ED Notes (Signed)
ED Provider at bedside. 

## 2017-12-12 NOTE — ED Triage Notes (Signed)
C/o pain to right eye started last night BEFORE removing contact-denies injury prior to removal-NAD-steady gait

## 2017-12-12 NOTE — ED Provider Notes (Signed)
[facility] EMERGENCY DEPARTMENT Provider Note   CSN: [id number] Arrival date & time: 12/12/17  1731     History   Chief Complaint Chief Complaint  Patient presents with  . Eye Pain    HPI Angela Pineda is a 56 y.o. female.  HPI  Patient is a 56 year old female presenting with eye pain.  She woke up at 2 AM with a severe eye pain.  She took out her contact.  She is sleeping in a context occasionally because she is working so hard for the holidays.  Patient reports that these are long-term contacts.  Patient pain is increased throughout the day.  Patient is unable to see out of right eye.  Endorese headache.  And has only light dark differentiation on the right.  Past Medical History:  Diagnosis Date  . Anemia   . Arthritis    back,neck  . Bronchitis   . Chronic back pain   . Chronic headaches   . Colon polyps    per patient recollection-in [place]  . Depression   . Neck pain, chronic   . Nerve damage   . Panic attacks     Patient Active Problem List   Diagnosis Date Noted  . Diarrhea 01/20/2017  . Normocytic anemia 01/20/2017  . Dehydration 01/19/2017  . Radicular low back pain 08/06/2013    Past Surgical History:  Procedure Laterality Date  . ABLATION ON ENDOMETRIOSIS    . BREAST CYST EXCISION Right   . COLONOSCOPY    . HEMORRHOID SURGERY    . KIDNEY SURGERY    . NECK SURGERY  2002  . POLYPECTOMY    . TONSILLECTOMY      OB History    No data available       Home Medications    Prior to Admission medications   Medication Sig Start Date End Date Taking? Authorizing Provider  amoxicillin-clavulanate (AUGMENTIN) 875-125 MG tablet Take 1 tablet by mouth every 12 (twelve) hours. 05/10/17   Julianne Rice, MD  clonazePAM (KLONOPIN) 0.5 MG tablet Take 0.5 mg by mouth 2 (two) times daily as needed for anxiety.    [provider]  cyclobenzaprine (FLEXERIL) 10 MG tablet Take 1 tablet (10 mg total) by mouth 2 (two) times daily  as needed for muscle spasms. 12/08/17   Doristine Devoid, PA-C  dicyclomine (BENTYL) 10 MG capsule Take 1 capsule (10 mg total) by mouth 3 (three) times daily before meals. 01/23/17   Roma Schanz R, DO  dicyclomine (BENTYL) 10 MG capsule TAKE 1 CAPSULE(10 MG) BY MOUTH EVERY 8 HOURS AS NEEDED FOR SPASMS 09/25/17   Armbruster, Carlota Raspberry, MD  HYDROcodone-acetaminophen (NORCO/VICODIN) 5-325 MG per tablet Take 1-2 tablets by mouth every 4 (four) hours as needed for moderate pain. 08/23/14   Hazel Sams, PA-C  methylPREDNISolone (MEDROL DOSEPAK) 4 MG TBPK tablet Take as directed 12/08/17   Ocie Cornfield T, PA-C  omeprazole (PRILOSEC) 40 MG capsule Take 1 capsule (40 mg total) by mouth daily. Patient not taking: Reported on 04/05/2017 01/29/17   Saguier, Percell Miller, PA-C  ondansetron (ZOFRAN) 4 MG tablet Take 1 tablet (4 mg total) by mouth every 6 (six) hours as needed for nausea or vomiting. Patient not taking: Reported on 04/05/2017 02/04/17   Levin Erp, PA    Family History Family History  Problem Relation Age of Onset  . Hypertension Father   . Lung cancer Mother   . Emphysema Mother  Smoker  . Lung cancer Brother   . Liver cancer Maternal Grandmother   . Colon cancer Neg Hx     Social History Social History   Tobacco Use  . Smoking status: Current Every Day Smoker    Packs/day: 1.00    Types: Cigarettes  . Smokeless tobacco: Never Used  Substance Use Topics  . Alcohol use: No  . Drug use: No     Allergies   Anaprox [naproxen sodium] and Tramadol   Review of Systems Review of Systems  Constitutional: Negative for activity change.  Respiratory: Negative for shortness of breath.   Cardiovascular: Negative for chest pain.  Gastrointestinal: Negative for abdominal pain.  Neurological: Positive for headaches.  All other systems reviewed and are negative.    Physical Exam Updated Vital Signs BP 108/83 (BP Location: Left Arm)   Pulse 86   Temp 98.4  F (36.9 C) (Oral)   Resp 18   Ht 5\' 2"  (1.575 m)   Wt 47.6 kg (105 lb)   SpO2 100%   BMI 19.20 kg/m   Physical Exam  Constitutional: She appears well-developed and well-nourished. No distress.  HENT:  Head: Normocephalic and atraumatic.  Eyes:  Abnormal on the right.  See pictures.  Has pupils that are reactive although sluggish.  Definite haziness of the cornea.  Edema of the sclera with injected conjunctiva.  Neck: Neck supple.  Cardiovascular: Normal rate and regular rhythm.  No murmur heard. Pulmonary/Chest: Effort normal and breath sounds normal. No respiratory distress.  Abdominal: Soft. There is no tenderness.  Musculoskeletal: She exhibits no edema.  Neurological: She is alert.  Skin: Skin is warm and dry.  Psychiatric: She has a normal mood and affect.  Nursing note and vitals reviewed.        ED Treatments / Results  Labs (all labs ordered are listed, but only abnormal results are displayed) Labs Reviewed - No data to display  EKG  EKG Interpretation None       Radiology No results found.  Procedures Procedures (including critical care time)  Medications Ordered in ED Medications  tetracaine (PONTOCAINE) 0.5 % ophthalmic solution 2 drop (2 drops Right Eye Given 12/12/17 1830)  fluorescein ophthalmic strip 1 strip (1 strip Right Eye Given 12/12/17 1830)     Initial Impression / Assessment and Plan / ED Course  I have reviewed the triage vital signs and the nursing notes.  Pertinent labs & imaging results that were available during my care of the patient were reviewed by me and considered in my medical decision making (see chart for details).     Patient is a 56 year old female presenting with eye pain.  She woke up at 2 AM with a severe eye pain.  She took out her contact.  She is sleeping in a context occasionally because she is working so hard for the holidays.  Patient reports that these are long-term contacts.  Patient pain is increased  throughout the day.  Patient is unable to see out of right eye.  Endorese headache.  And has only light dark differentiation on the right.  When stain, there is a complete fluorosscein uptake across the whole cornea.  Pressures of 41 and 44 in R eye.   8:40 PM Discussed with Dr. Carolynn Sayers.  This is acute angle closure glaucoma versus infection.  Will treat for both.  Patient will follow up with Dr. Carolynn Sayers in the morning.  Ports that we do not have any eyedrops here.  Patient told  that she needs to immediately go to the pharmacy to get these eyedrops.  Also warned about complete vision loss and disability if she does not return to follow-up with Dr. Carolynn Sayers as soon as possible in the morning.  She expresses understanding.  Final Clinical Impressions(s) / ED Diagnoses   Final diagnoses:  None    ED Discharge Orders    None       Macarthur Critchley, MD 12/12/17 2042

## 2017-12-19 DIAGNOSIS — F17210 Nicotine dependence, cigarettes, uncomplicated: Secondary | ICD-10-CM | POA: Diagnosis not present

## 2017-12-19 DIAGNOSIS — H53149 Visual discomfort, unspecified: Secondary | ICD-10-CM | POA: Diagnosis not present

## 2017-12-19 DIAGNOSIS — R51 Headache: Secondary | ICD-10-CM | POA: Diagnosis not present

## 2017-12-19 DIAGNOSIS — Z885 Allergy status to narcotic agent status: Secondary | ICD-10-CM | POA: Diagnosis not present

## 2017-12-19 DIAGNOSIS — H40211 Acute angle-closure glaucoma, right eye: Secondary | ICD-10-CM | POA: Diagnosis not present

## 2017-12-19 DIAGNOSIS — H5711 Ocular pain, right eye: Secondary | ICD-10-CM | POA: Diagnosis not present

## 2018-01-07 IMAGING — CT CT ABD-PELV W/ CM
2 of 5 series · 16 of 46 positions shown, 18 images · IV contrast (iopamidol)
Comparison: 05/31/2012

CLINICAL DATA: Diffuse abdominal pain and nausea. Seen on [DATE] for the same.

EXAM:
CT ABDOMEN AND PELVIS WITH CONTRAST
TECHNIQUE: Multidetector CT imaging of the abdomen and pelvis was performed
using the standard protocol following bolus administration of
intravenous contrast.
CONTRAST:  100mL NAAF2S-H00 IOPAMIDOL (NAAF2S-H00) INJECTION 61%

[Series 2: axial st · axial · 0.63mm/px · z∈[-512,-172]mm · 13 of 78 slices shown, 15 images]
[im 5/78  soft-tissue]
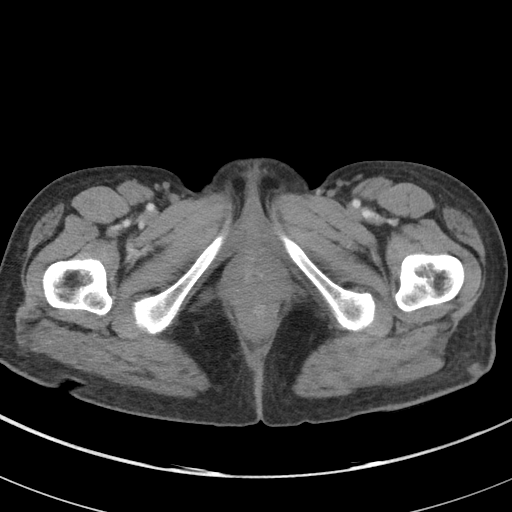
[im 5/78  bone]
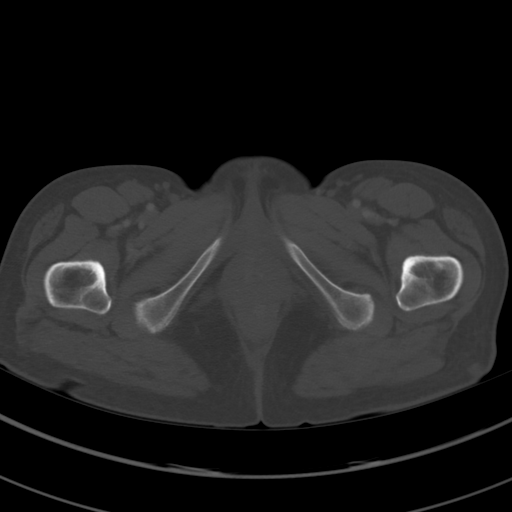
[im 9/78  soft-tissue]
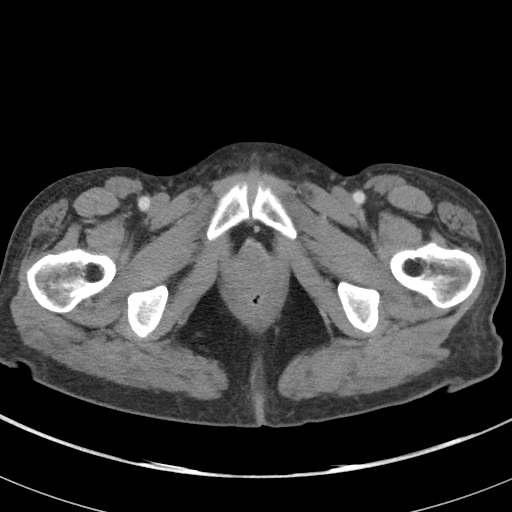
[im 18/78  soft-tissue]
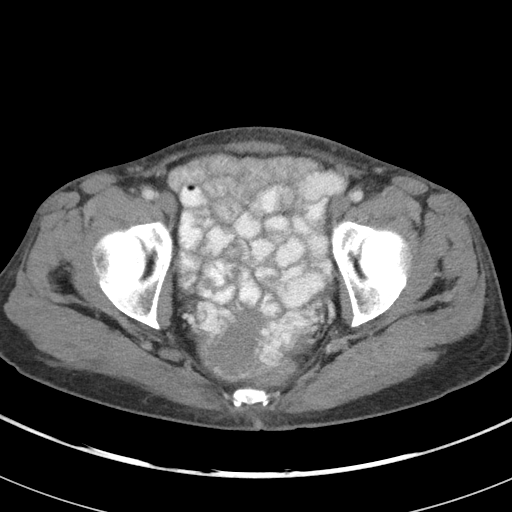
[im 22/78  soft-tissue]
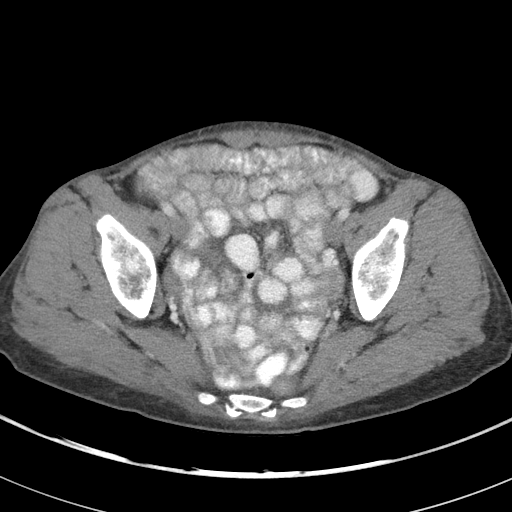
[im 26/78  soft-tissue]
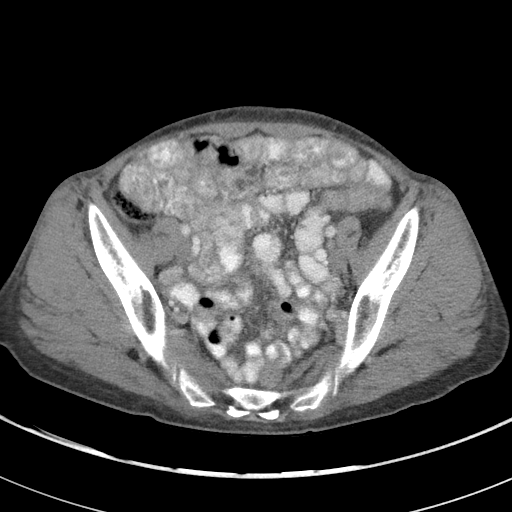
[im 35/78  soft-tissue]
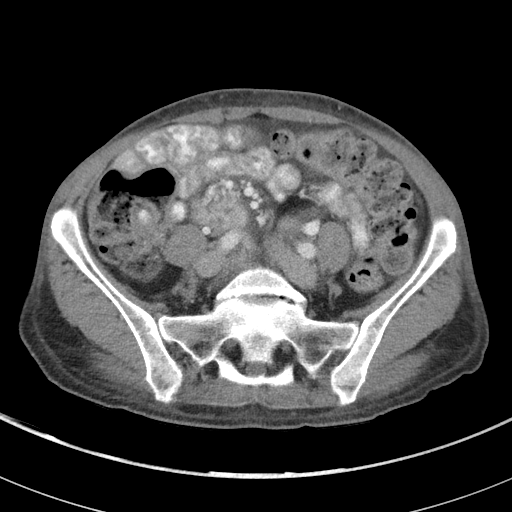
[im 39/78  soft-tissue]
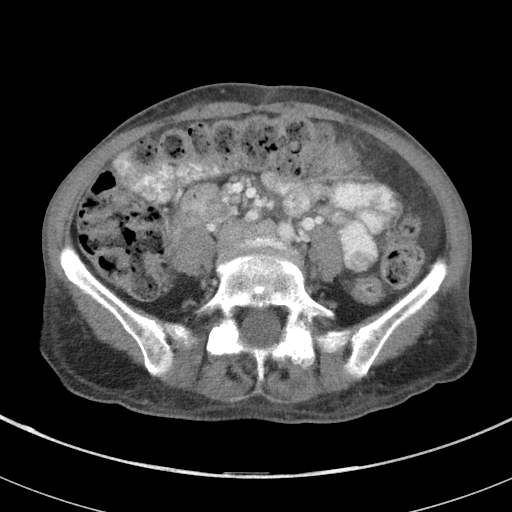
[im 43/78  soft-tissue]
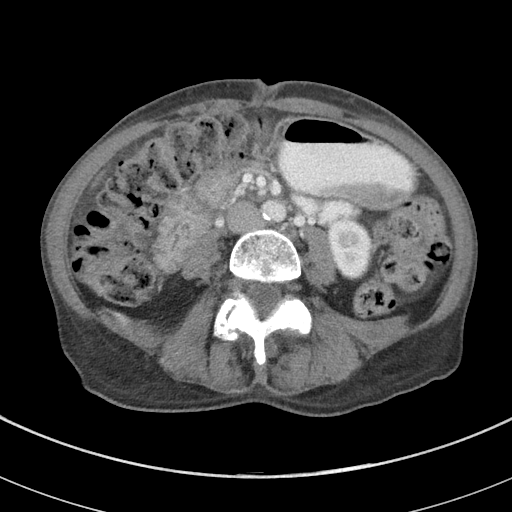
[im 52/78  soft-tissue]
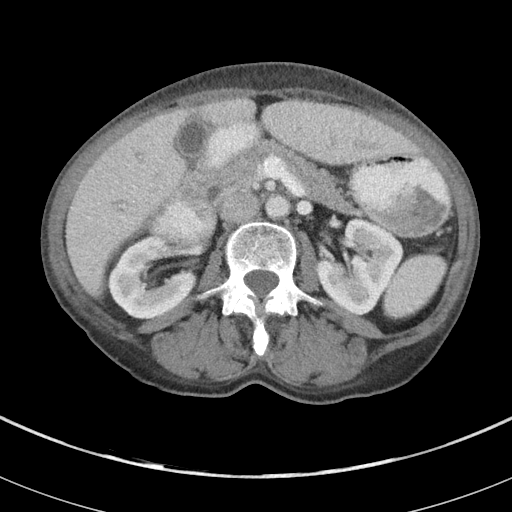
[im 52/78  bone]
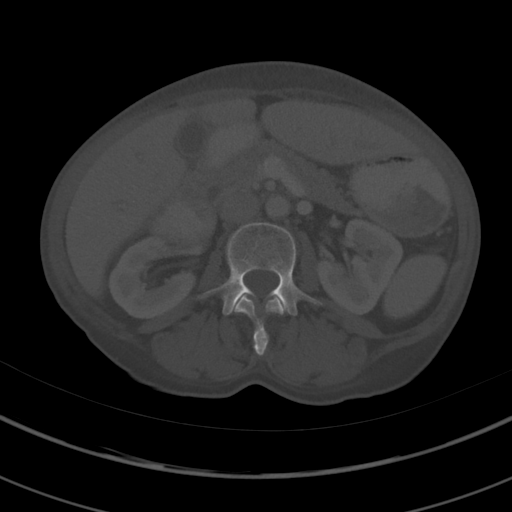
[im 56/78  soft-tissue]
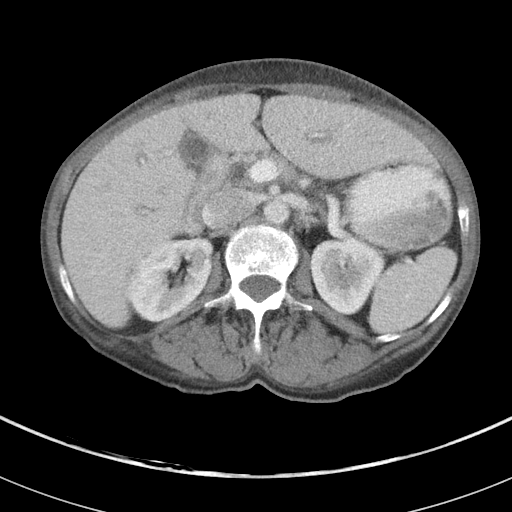
[im 60/78  soft-tissue]
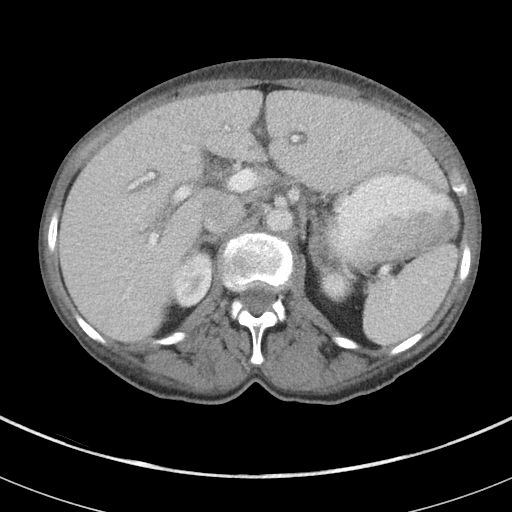
[im 69/78  soft-tissue]
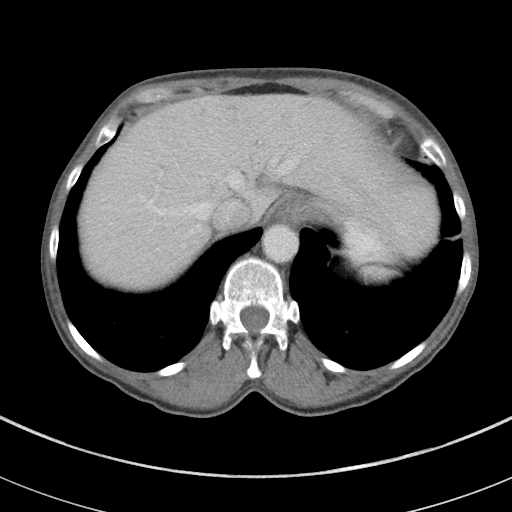
[im 73/78  soft-tissue]
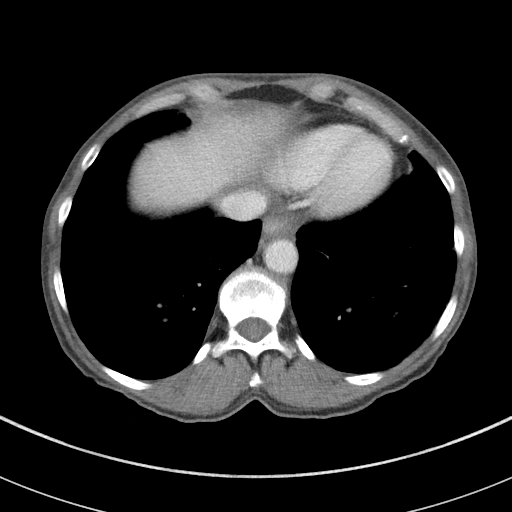

[Series 5: coronal st · coronal · 0.62mm/px · 3 of 85 slices shown]
[im 29/85  soft-tissue]
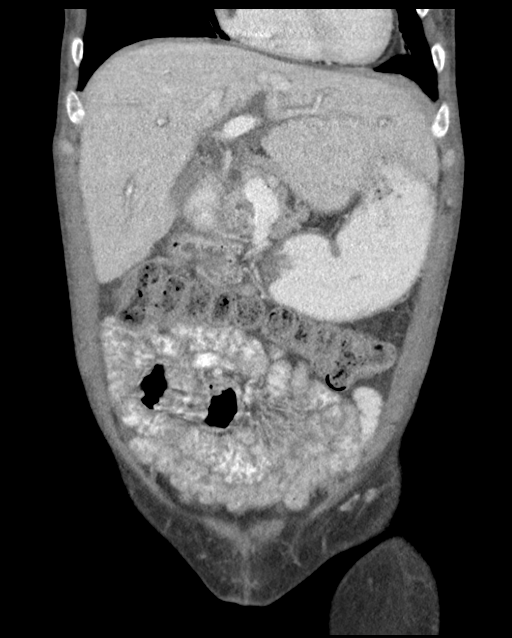
[im 38/85  soft-tissue]
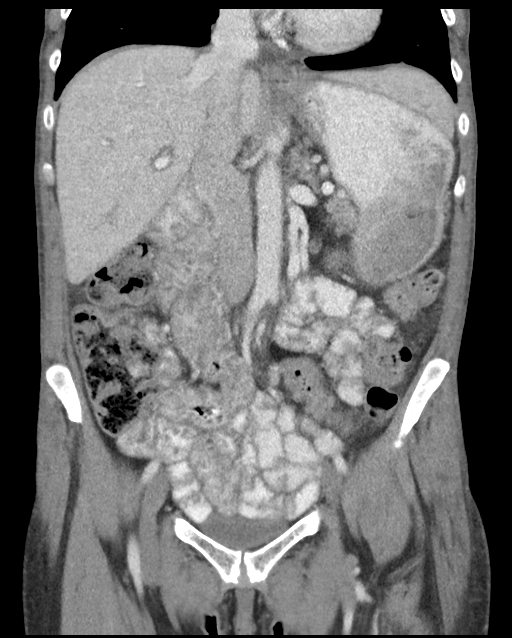
[im 47/85  soft-tissue]
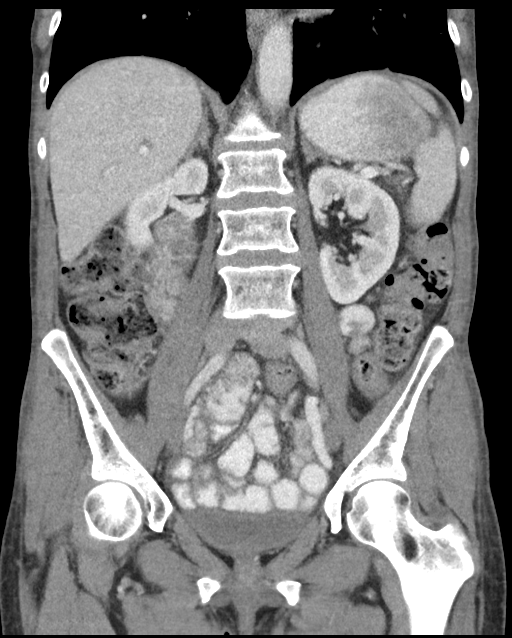

[16 of 46 positions shown; findings below may reference images not displayed]

FINDINGS: Lower chest: No acute abnormality. Mild linear scarring in the
bases.

Hepatobiliary: No focal liver abnormality. There is periportal
edema, nonspecific. No gallstones, gallbladder wall thickening, or
biliary dilatation.

Pancreas: Unremarkable. No pancreatic ductal dilatation or
surrounding inflammatory changes.

Spleen: Normal in size without focal abnormality.

Adrenals/Urinary Tract: Adrenal glands are unremarkable. Kidneys are
normal, without renal calculi, focal lesion, or hydronephrosis.
Bladder is unremarkable.

Stomach/Bowel: Stomach is within normal limits. Appendix appears
normal. No evidence of bowel wall thickening, distention, or
inflammatory changes. Enteric contrast has reached the cecum.

Vascular/Lymphatic: The abdominal aorta is normal in caliber with
minimal atherosclerotic calcification. No adenopathy.

Reproductive: Uterus and bilateral adnexa are unremarkable.

Other: No ascites.  No focal inflammation.

Musculoskeletal: No significant skeletal lesion. Severe facet
arthritis from L4 through the sacrum with mild degenerative
spondylolisthesis at L4-5 and L5-S1
IMPRESSION: 1. No acute inflammatory changes in the abdomen or pelvis.
2. Periportal edema in the liver, a nonspecific finding that
sometimes merely reflects hydration state. This may be viewed with
greater suspicion if there are liver function test abnormalities.
Normal gallbladder and bile ducts.
3. Severe facet arthritis in the lower lumbar spine.

## 2018-01-27 DIAGNOSIS — F410 Panic disorder [episodic paroxysmal anxiety] without agoraphobia: Secondary | ICD-10-CM | POA: Diagnosis not present

## 2018-01-27 DIAGNOSIS — F3342 Major depressive disorder, recurrent, in full remission: Secondary | ICD-10-CM | POA: Diagnosis not present

## 2018-02-07 ENCOUNTER — Other Ambulatory Visit: Payer: Self-pay

## 2018-02-07 ENCOUNTER — Emergency Department (HOSPITAL_BASED_OUTPATIENT_CLINIC_OR_DEPARTMENT_OTHER)
Admission: EM | Admit: 2018-02-07 | Discharge: 2018-02-07 | Disposition: A | Discharge status: Home / Self Care | Payer: Federal, State, Local not specified - PPO | Attending: Emergency Medicine | Admitting: Emergency Medicine

## 2018-02-07 ENCOUNTER — Emergency Department (HOSPITAL_BASED_OUTPATIENT_CLINIC_OR_DEPARTMENT_OTHER): Payer: Federal, State, Local not specified - PPO

## 2018-02-07 ENCOUNTER — Encounter (HOSPITAL_BASED_OUTPATIENT_CLINIC_OR_DEPARTMENT_OTHER): Payer: Self-pay | Admitting: *Deleted

## 2018-02-07 DIAGNOSIS — R112 Nausea with vomiting, unspecified: Secondary | ICD-10-CM | POA: Diagnosis not present

## 2018-02-07 DIAGNOSIS — Z79899 Other long term (current) drug therapy: Secondary | ICD-10-CM | POA: Insufficient documentation

## 2018-02-07 DIAGNOSIS — R197 Diarrhea, unspecified: Secondary | ICD-10-CM | POA: Diagnosis not present

## 2018-02-07 DIAGNOSIS — F17210 Nicotine dependence, cigarettes, uncomplicated: Secondary | ICD-10-CM | POA: Diagnosis not present

## 2018-02-07 DIAGNOSIS — R1084 Generalized abdominal pain: Secondary | ICD-10-CM | POA: Insufficient documentation

## 2018-02-07 DIAGNOSIS — R1110 Vomiting, unspecified: Secondary | ICD-10-CM | POA: Diagnosis not present

## 2018-02-07 DIAGNOSIS — R109 Unspecified abdominal pain: Secondary | ICD-10-CM | POA: Diagnosis not present

## 2018-02-07 LAB — COMPREHENSIVE METABOLIC PANEL
ALK PHOS: 68 U/L (ref 38–126)
ALT: 15 U/L (ref 14–54)
ANION GAP: 8 (ref 5–15)
AST: 22 U/L (ref 15–41)
Albumin: 4 g/dL (ref 3.5–5.0)
BILIRUBIN TOTAL: 0.3 mg/dL (ref 0.3–1.2)
BUN: 11 mg/dL (ref 6–20)
CALCIUM: 9.2 mg/dL (ref 8.9–10.3)
CO2: 22 mmol/L (ref 22–32)
Chloride: 110 mmol/L (ref 101–111)
Creatinine, Ser: 0.67 mg/dL (ref 0.44–1.00)
Glucose, Bld: 111 mg/dL — ABNORMAL HIGH (ref 65–99)
Potassium: 3.6 mmol/L (ref 3.5–5.1)
Sodium: 140 mmol/L (ref 135–145)
TOTAL PROTEIN: 6.3 g/dL — AB (ref 6.5–8.1)

## 2018-02-07 LAB — CBC WITH DIFFERENTIAL/PLATELET
BASOS ABS: 0 10*3/uL (ref 0.0–0.1)
BASOS PCT: 1 %
EOS ABS: 0.4 10*3/uL (ref 0.0–0.7)
Eosinophils Relative: 6 %
HCT: 36.8 % (ref 36.0–46.0)
HEMOGLOBIN: 12.5 g/dL (ref 12.0–15.0)
Lymphocytes Relative: 33 %
Lymphs Abs: 2.1 10*3/uL (ref 0.7–4.0)
MCH: 33.9 pg (ref 26.0–34.0)
MCHC: 34 g/dL (ref 30.0–36.0)
MCV: 99.7 fL (ref 78.0–100.0)
MONOS PCT: 6 %
Monocytes Absolute: 0.4 10*3/uL (ref 0.1–1.0)
NEUTROS PCT: 54 %
Neutro Abs: 3.5 10*3/uL (ref 1.7–7.7)
Platelets: 239 10*3/uL (ref 150–400)
RBC: 3.69 MIL/uL — AB (ref 3.87–5.11)
RDW: 13.3 % (ref 11.5–15.5)
WBC: 6.3 10*3/uL (ref 4.0–10.5)

## 2018-02-07 LAB — LIPASE, BLOOD: LIPASE: 25 U/L (ref 11–51)

## 2018-02-07 MED ORDER — DICYCLOMINE HCL 20 MG PO TABS: 20.0000 mg | ORAL_TABLET | Freq: Two times a day (BID) | ORAL | 0 refills | Status: AC

## 2018-02-07 MED ORDER — ONDANSETRON HCL 4 MG/2ML IJ SOLN
4.0000 mg | Freq: Once | INTRAMUSCULAR | Status: AC
  Administered 2018-02-07: 4 mg via INTRAVENOUS
  Filled 2018-02-07: qty 2

## 2018-02-07 MED ORDER — MECLIZINE HCL 25 MG PO TABS
25.0000 mg | ORAL_TABLET | Freq: Once | ORAL | Status: AC
Start: 2018-02-07 — End: 2018-02-07
  Administered 2018-02-07: 25 mg via ORAL
  Filled 2018-02-07: qty 1

## 2018-02-07 MED ORDER — IOPAMIDOL (ISOVUE-300) INJECTION 61%
100.0000 mL | Freq: Once | INTRAVENOUS | Status: AC | PRN
  Administered 2018-02-07: 100 mL via INTRAVENOUS

## 2018-02-07 MED ORDER — MORPHINE SULFATE (PF) 2 MG/ML IV SOLN
2.0000 mg | Freq: Once | INTRAVENOUS | Status: AC
  Administered 2018-02-07: 2 mg via INTRAVENOUS
  Filled 2018-02-07: qty 1

## 2018-02-07 MED ORDER — SODIUM CHLORIDE 0.9 % IV BOLUS (SEPSIS)
1000.0000 mL | Freq: Once | INTRAVENOUS | Status: AC
  Administered 2018-02-07: 1000 mL via INTRAVENOUS

## 2018-02-07 MED ORDER — LOPERAMIDE HCL 2 MG PO CAPS: 2.0000 mg | ORAL_CAPSULE | Freq: Four times a day (QID) | ORAL | 0 refills | Status: AC | PRN

## 2018-02-07 MED ORDER — ONDANSETRON 4 MG PO TBDP: 4.0000 mg | ORAL_TABLET | Freq: Three times a day (TID) | ORAL | 0 refills | Status: AC | PRN

## 2018-02-07 MED ORDER — DICYCLOMINE HCL 10 MG PO CAPS
10.0000 mg | ORAL_CAPSULE | Freq: Once | ORAL | Status: AC
  Administered 2018-02-07: 10 mg via ORAL
  Filled 2018-02-07: qty 1

## 2018-02-07 NOTE — Discharge Instructions (Signed)

## 2018-02-07 NOTE — ED Notes (Signed)
ED Provider at bedside. 

## 2018-02-07 NOTE — ED Notes (Signed)
Pt laid flat in preparation for orthostatic VS

## 2018-02-07 NOTE — ED Notes (Signed)
MD made aware of orthostatic VS and hypotension.

## 2018-02-07 NOTE — ED Provider Notes (Signed)
Emergency Department Provider Note   I have reviewed the triage vital signs and the nursing notes.   HISTORY  Chief Complaint Emesis   HPI Angela Pineda is a 57 y.o. female resents to the emergency department for evaluation of mild abdominal discomfort with nausea, vomiting, diarrhea.  Patient reports some intermittent lightheadedness and feeling off balance when she walks.  She denies any focal numbness or weakness.  No vision changes.  No falls or head injury.  She states her abdominal pain is mostly in the lower abdomen but denies any vaginal bleeding or discharge.  No dysuria, hesitancy, urgency.  No fevers or chills.  She denies prior history of similar pain or symptoms.  Denies specific vertigo symptoms.    Past Medical History:  Diagnosis Date  . Anemia   . Arthritis    back,neck  . Bronchitis   . Chronic back pain   . Chronic headaches   . Colon polyps    per patient recollection-in [place]  . Depression   . Neck pain, chronic   . Nerve damage   . Panic attacks     Patient Active Problem List   Diagnosis Date Noted  . Diarrhea 01/20/2017  . Normocytic anemia 01/20/2017  . Dehydration 01/19/2017  . Radicular low back pain 08/06/2013    Past Surgical History:  Procedure Laterality Date  . ABLATION ON ENDOMETRIOSIS    . BREAST CYST EXCISION Right   . COLONOSCOPY    . HEMORRHOID SURGERY    . KIDNEY SURGERY    . NECK SURGERY  2002  . POLYPECTOMY    . TONSILLECTOMY      Current Outpatient Rx  . Order #: [id number] Class: Print  . Order #: [id number] Class: Print  . Order #: [id number] Class: Historical Med  . Order #: [id number] Class: Print  . Order #: [id number] Class: Print  . Order #: [id number] Class: Print  . Order #: [id number] Class: Print  . Order #: [id number] Class: Print  . Order #: [id number] Class: Print  . Order #: [id number] Class: Normal  . Order #: [id number] Class: Print  . Order #: [id number] Class: Print    Allergies Anaprox [naproxen sodium]  and Tramadol  Family History  Problem Relation Age of Onset  . Hypertension Father   . Lung cancer Mother   . Emphysema Mother        Smoker  . Lung cancer Brother   . Liver cancer Maternal Grandmother   . Colon cancer Neg Hx     Social History Social History   Tobacco Use  . Smoking status: Current Every Day Smoker    Packs/day: 1.00    Types: Cigarettes  . Smokeless tobacco: Never Used  Substance Use Topics  . Alcohol use: No  . Drug use: No    Review of Systems  Constitutional: No fever/chills. Positive lightheadedness.  Eyes: No visual changes. ENT: No sore throat. Cardiovascular: Denies chest pain. Respiratory: Denies shortness of breath. Gastrointestinal: Positive abdominal pain. Positive nausea, vomiting, and diarrhea.  No constipation. Genitourinary: Negative for dysuria. Musculoskeletal: Negative for back pain. Skin: Negative for rash. Neurological: Negative for headaches, focal weakness or numbness.  10-point ROS otherwise negative.  ____________________________________________   PHYSICAL EXAM:  VITAL SIGNS: ED Triage Vitals  Enc Vitals Group     BP 02/07/18 1700 (!) 122/57     Pulse Rate 02/07/18 1700 96     Resp 02/07/18 1700 20     Temp 02/07/18 1700 97.8 F (36.6 C)     Temp Source 02/07/18  1700 Oral     SpO2 02/07/18 1700 99 %     Weight 02/07/18 1658 110 lb (49.9 kg)     Height 02/07/18 1658 5\' 2"  (1.575 m)     Pain Score 02/07/18 1658 8   Constitutional: Alert and oriented. Well appearing and in no acute distress. Eyes: Conjunctivae are normal. PERRL. Head: Atraumatic. Nose: No congestion/rhinnorhea. Mouth/Throat: Mucous membranes are dry. Oropharynx non-erythematous. Neck: No stridor.  Cardiovascular: Normal rate, regular rhythm. Good peripheral circulation. Grossly normal heart sounds.   Respiratory: Normal respiratory effort.  No retractions. Lungs CTAB. Gastrointestinal: Soft with mild diffuse tenderness to palpation. No rebound  or guarding. No distention.  Musculoskeletal: No lower extremity tenderness nor edema. No gross deformities of extremities. Neurologic:  Normal speech and language. No gross focal neurologic deficits are appreciated. Normal gait.  Skin:  Skin is warm, dry and intact. No rash noted.  ____________________________________________   LABS (all labs ordered are listed, but only abnormal results are displayed)  Labs Reviewed  COMPREHENSIVE METABOLIC PANEL - Abnormal; Notable for the following components:      Result Value   Glucose, Bld 111 (*)    Total Protein 6.3 (*)    All other components within normal limits  CBC WITH DIFFERENTIAL/PLATELET - Abnormal; Notable for the following components:   RBC 3.69 (*)    All other components within normal limits  LIPASE, BLOOD   ____________________________________________  RADIOLOGY  Ct Abdomen Pelvis W Contrast  Result Date: 02/07/2018 CLINICAL DATA:  Diarrhea x 1 day; nausea; vomiting; dizziness today; unable to keep liquids down; abdominal pain located mid abdomen; Sx h/o tubal ligation; ablation EXAM: CT ABDOMEN AND PELVIS WITH CONTRAST TECHNIQUE: Multidetector CT imaging of the abdomen and pelvis was performed using the standard protocol following bolus administration of intravenous contrast. CONTRAST:  143mL ISOVUE-300 IOPAMIDOL (ISOVUE-300) INJECTION 61% COMPARISON:  02/08/2017 FINDINGS: Lower chest: Scattered small blebs in the visualized lower lobes left worse than right. No pleural or pericardial effusion. Hepatobiliary: No focal liver abnormality is seen. No gallstones, gallbladder wall thickening, or biliary dilatation. Pancreas: Unremarkable. No pancreatic ductal dilatation or surrounding inflammatory changes. Spleen: Normal in size without focal abnormality. Adrenals/Urinary Tract: Adrenal glands are unremarkable. Kidneys are normal, without renal calculi, focal lesion, or hydronephrosis. Bladder is unremarkable. Stomach/Bowel: Stomach is  partially distended by ingested material. Small bowel is nondilated. Normal appendix. The colon is incompletely distended, unremarkable. Vascular/Lymphatic: Aortic Atherosclerosis (ICD10-170.0) without aneurysm or stenosis. Dilated refluxing left gonadal vein supplies multiple adnexal veins bilaterally. No abdominal or pelvic adenopathy. Reproductive: Uterus and ovaries unremarkable. Multiple dilated enhancing adnexal veins supplied by refluxing left gonadal vein. Other: No ascites.  No free air. Musculoskeletal: Lower lumbar facet DJD allowing grade 1 anterolisthesis L4-5 and L5-S1. No pars defects. No fracture or worrisome bone lesion. IMPRESSION: 1. No acute findings. 2. Refluxing left gonadal vein supplying dilated bilateral adnexal veins. Correlate with any clinical evidence of pelvic congestion syndrome. 3. Lower lumbar facet and disc disease with grade 1 anterolisthesis L4-5, L5-S1. Electronically Signed   By: Lucrezia Europe M.D.   On: 02/07/2018 20:12    ____________________________________________   PROCEDURES  Procedure(s) performed:   Procedures  None ____________________________________________   INITIAL IMPRESSION / ASSESSMENT AND PLAN / ED COURSE  Pertinent labs & imaging results that were available during my care of the patient were reviewed by me and considered in my medical decision making (see chart for details).  Patient presents to the ED with mild abdominal pain with nausea,  vomiting, diarrhea.  In addition to this she feels off balance and somewhat lightheaded with walking.  No fever or other infection symptoms.  She has only mild tenderness to palpation of the abdomen.  Normal gait.  No focal neuro deficits.  Suspect the patient may be which is increasing her lightheaded sensation.  Plan for Zofran, Bentyl, meclizine along with IV fluids and labs.  No abdominal imaging at this time.  07:00 PM BP slightly low but patient states this is typical for her. Continues to have  abdominal pain. Labs unremarkable. With no improvement in pain plan for CT abdomen/pelvis for further evaluation.   CT abdomen/pelvis reviewed with no acute finding. Symptoms not consistent with pelvic congestion syndrome but discussed this possible dx with the patient and provided contact info with the patient for f/u. Patient is tolerating PO here. Plan for supportive care and home of likely viral illness.   At this time, I do not feel there is any life-threatening condition present. I have reviewed and discussed all results (EKG, imaging, lab, urine as appropriate), exam findings with patient. I have reviewed nursing notes and appropriate previous records.  I feel the patient is safe to be discharged home without further emergent workup. Discussed usual and customary return precautions. Patient and family (if present) verbalize understanding and are comfortable with this plan.  Patient will follow-up with their primary care provider. If they do not have a primary care provider, information for follow-up has been provided to them. All questions have been answered.  ____________________________________________  FINAL CLINICAL IMPRESSION(S) / ED DIAGNOSES  Final diagnoses:  Nausea vomiting and diarrhea  Generalized abdominal pain     MEDICATIONS GIVEN DURING THIS VISIT:  Medications  sodium chloride 0.9 % bolus 1,000 mL (0 mLs Intravenous Stopped 02/07/18 1852)  ondansetron (ZOFRAN) injection 4 mg (4 mg Intravenous Given 02/07/18 1744)  dicyclomine (BENTYL) capsule 10 mg (10 mg Oral Given 02/07/18 1754)  meclizine (ANTIVERT) tablet 25 mg (25 mg Oral Given 02/07/18 1754)  sodium chloride 0.9 % bolus 1,000 mL (0 mLs Intravenous Stopped 02/07/18 2009)  morphine 2 MG/ML injection 2 mg (2 mg Intravenous Given 02/07/18 1939)  iopamidol (ISOVUE-300) 61 % injection 100 mL (100 mLs Intravenous Contrast Given 02/07/18 1947)     NEW OUTPATIENT MEDICATIONS STARTED DURING THIS VISIT:  Discharge Medication  List as of 02/07/2018  8:30 PM    START taking these medications   Details  dicyclomine (BENTYL) 20 MG tablet Take 1 tablet (20 mg total) by mouth 2 (two) times daily., Starting Fri 02/07/2018, Print    loperamide (IMODIUM) 2 MG capsule Take 1 capsule (2 mg total) by mouth 4 (four) times daily as needed for diarrhea or loose stools., Starting Fri 02/07/2018, Print    ondansetron (ZOFRAN ODT) 4 MG disintegrating tablet Take 1 tablet (4 mg total) by mouth every 8 (eight) hours as needed for nausea or vomiting., Starting Fri 02/07/2018, Print        Note:  This document was prepared using Dragon voice recognition software and may include unintentional dictation errors.  Nanda Quinton, MD Emergency Medicine    Long, Wonda Olds, MD 02/08/18 [phone]

## 2018-02-07 NOTE — ED Triage Notes (Signed)
Diarrhea yesterday. Nausea headache dizziness today.

## 2018-02-07 NOTE — ED Notes (Signed)
Pt unable to void at this time. 

## 2018-02-07 NOTE — ED Notes (Signed)
Pt unable to void, states will push call bell when able.

## 2018-03-07 DIAGNOSIS — R109 Unspecified abdominal pain: Secondary | ICD-10-CM | POA: Diagnosis not present

## 2018-03-07 DIAGNOSIS — K529 Noninfective gastroenteritis and colitis, unspecified: Secondary | ICD-10-CM | POA: Diagnosis not present

## 2018-03-07 DIAGNOSIS — E872 Acidosis: Secondary | ICD-10-CM | POA: Diagnosis not present

## 2018-03-07 DIAGNOSIS — R112 Nausea with vomiting, unspecified: Secondary | ICD-10-CM | POA: Diagnosis not present

## 2018-03-07 DIAGNOSIS — R1110 Vomiting, unspecified: Secondary | ICD-10-CM | POA: Diagnosis not present

## 2018-03-07 DIAGNOSIS — R197 Diarrhea, unspecified: Secondary | ICD-10-CM | POA: Diagnosis not present

## 2018-03-08 DIAGNOSIS — F419 Anxiety disorder, unspecified: Secondary | ICD-10-CM | POA: Diagnosis not present

## 2018-03-08 DIAGNOSIS — K529 Noninfective gastroenteritis and colitis, unspecified: Secondary | ICD-10-CM | POA: Diagnosis not present

## 2018-03-09 DIAGNOSIS — K529 Noninfective gastroenteritis and colitis, unspecified: Secondary | ICD-10-CM | POA: Diagnosis not present

## 2018-03-19 DIAGNOSIS — G894 Chronic pain syndrome: Secondary | ICD-10-CM | POA: Diagnosis not present

## 2018-03-19 DIAGNOSIS — M545 Low back pain: Secondary | ICD-10-CM | POA: Diagnosis not present

## 2018-03-19 DIAGNOSIS — Z79899 Other long term (current) drug therapy: Secondary | ICD-10-CM | POA: Diagnosis not present

## 2018-06-18 DIAGNOSIS — M5136 Other intervertebral disc degeneration, lumbar region: Secondary | ICD-10-CM | POA: Diagnosis not present

## 2018-08-09 DIAGNOSIS — F410 Panic disorder [episodic paroxysmal anxiety] without agoraphobia: Secondary | ICD-10-CM | POA: Diagnosis not present

## 2018-08-09 DIAGNOSIS — F3342 Major depressive disorder, recurrent, in full remission: Secondary | ICD-10-CM | POA: Diagnosis not present

## 2018-09-23 DIAGNOSIS — Z79899 Other long term (current) drug therapy: Secondary | ICD-10-CM | POA: Diagnosis not present

## 2018-09-23 DIAGNOSIS — M5417 Radiculopathy, lumbosacral region: Secondary | ICD-10-CM | POA: Diagnosis not present

## 2018-10-27 ENCOUNTER — Emergency Department (HOSPITAL_BASED_OUTPATIENT_CLINIC_OR_DEPARTMENT_OTHER): Payer: Federal, State, Local not specified - PPO

## 2018-10-27 ENCOUNTER — Other Ambulatory Visit: Payer: Self-pay

## 2018-10-27 ENCOUNTER — Encounter (HOSPITAL_BASED_OUTPATIENT_CLINIC_OR_DEPARTMENT_OTHER): Payer: Self-pay | Admitting: Emergency Medicine

## 2018-10-27 ENCOUNTER — Emergency Department (HOSPITAL_BASED_OUTPATIENT_CLINIC_OR_DEPARTMENT_OTHER)
Admission: EM | Admit: 2018-10-27 | Discharge: 2018-10-27 | Disposition: A | Discharge status: Home / Self Care | Payer: Federal, State, Local not specified - PPO | Attending: Emergency Medicine | Admitting: Emergency Medicine

## 2018-10-27 DIAGNOSIS — Z79899 Other long term (current) drug therapy: Secondary | ICD-10-CM | POA: Diagnosis not present

## 2018-10-27 DIAGNOSIS — M25511 Pain in right shoulder: Secondary | ICD-10-CM

## 2018-10-27 DIAGNOSIS — Z87891 Personal history of nicotine dependence: Secondary | ICD-10-CM | POA: Diagnosis not present

## 2018-10-27 MED ORDER — IBUPROFEN 600 MG PO TABS: 600.0000 mg | ORAL_TABLET | Freq: Four times a day (QID) | ORAL | 0 refills | Status: AC | PRN

## 2018-10-27 MED ORDER — KETOROLAC TROMETHAMINE 30 MG/ML IJ SOLN
30.0000 mg | Freq: Once | INTRAMUSCULAR | Status: AC
  Administered 2018-10-27: 30 mg via INTRAMUSCULAR
  Filled 2018-10-27: qty 1

## 2018-10-27 NOTE — ED Notes (Signed)
Patient transported to X-ray 

## 2018-10-27 NOTE — ED Provider Notes (Signed)
[facility] EMERGENCY DEPARTMENT Provider Note   CSN: [id number] Arrival date & time: 10/27/18  0302     History   Chief Complaint Chief Complaint  Patient presents with  . Shoulder Pain    HPI Angela Pineda is a 57 y.o. female.  HPI  This is a 57 year old female who presents with right shoulder pain.  Reports 2-day history of worsening right shoulder pain.  It is worse with range of motion.  Patient reports that she took ibuprofen with minimal relief.  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  She denies any specific injury.  She denies any fevers or history of arthritis or inflammatory arthritis such as gout.  Denies any weakness, numbness, tingling in the right arm.  Currently she rates her pain at 7 out of 10.  Past Medical History:  Diagnosis Date  . Anemia   . Arthritis    back,neck  . Bronchitis   . Chronic back pain   . Chronic headaches   . Colon polyps    per patient recollection-in [place]  . Depression   . Neck pain, chronic   . Nerve damage   . Panic attacks     Patient Active Problem List   Diagnosis Date Noted  . Diarrhea 01/20/2017  . Normocytic anemia 01/20/2017  . Dehydration 01/19/2017  . Radicular low back pain 08/06/2013    Past Surgical History:  Procedure Laterality Date  . ABLATION ON ENDOMETRIOSIS    . BREAST CYST EXCISION Right   . COLONOSCOPY    . HEMORRHOID SURGERY    . KIDNEY SURGERY    . NECK SURGERY  2002  . POLYPECTOMY    . TONSILLECTOMY       OB History   None      Home Medications    Prior to Admission medications   Medication Sig Start Date End Date Taking? Authorizing Provider  amoxicillin-clavulanate (AUGMENTIN) 875-125 MG tablet Take 1 tablet by mouth every 12 (twelve) hours. 05/10/17   Julianne Rice, MD  brimonidine (ALPHAGAN P) 0.15 % ophthalmic solution Place 1 drop into the right eye 3 (three) times daily. 12/12/17   Mackuen,  Courteney Lyn, MD  clonazePAM (KLONOPIN) 0.5 MG tablet Take 0.5 mg by mouth 2 (two) times daily as needed for anxiety.    [provider]  cyclobenzaprine (FLEXERIL) 10 MG tablet Take 1 tablet (10 mg total) by mouth 2 (two) times daily as needed for muscle spasms. 12/08/17   Doristine Devoid, PA-C  dicyclomine (BENTYL) 20 MG tablet Take 1 tablet (20 mg total) by mouth 2 (two) times daily. 02/07/18   Long, Wonda Olds, MD  dorzolamide-timolol (COSOPT) 22.3-6.8 MG/ML ophthalmic solution Place 1 drop into the right eye 2 (two) times daily. 12/12/17   Mackuen, Courteney Lyn, MD  HYDROcodone-acetaminophen (NORCO/VICODIN) 5-325 MG per tablet Take 1-2 tablets by mouth every 4 (four) hours as needed for moderate pain. 08/23/14   Hazel Sams, PA-C  ibuprofen (ADVIL,MOTRIN) 600 MG tablet Take 1 tablet (600 mg total) by mouth every 6 (six) hours as needed. 10/27/18   Jametta Moorehead, Barbette Hair, MD  loperamide (IMODIUM) 2 MG capsule Take 1 capsule (2 mg total) by mouth 4 (four) times daily as needed for diarrhea or loose stools. 02/07/18   Margette Fast, MD  methylPREDNISolone (MEDROL DOSEPAK) 4 MG TBPK tablet Take as directed 12/08/17   Ocie Cornfield T, PA-C  omeprazole (PRILOSEC) 40 MG  capsule Take 1 capsule (40 mg total) by mouth daily. Patient not taking: Reported on 04/05/2017 01/29/17   Saguier, Percell Miller, PA-C  ondansetron (ZOFRAN ODT) 4 MG disintegrating tablet Take 1 tablet (4 mg total) by mouth every 8 (eight) hours as needed for nausea or vomiting. 02/07/18   Long, Wonda Olds, MD  ondansetron (ZOFRAN) 4 MG tablet Take 1 tablet (4 mg total) by mouth every 6 (six) hours as needed for nausea or vomiting. Patient not taking: Reported on 04/05/2017 02/04/17   Levin Erp, PA    Family History Family History  Problem Relation Age of Onset  . Hypertension Father   . Lung cancer Mother   . Emphysema Mother        Smoker  . Lung cancer Brother   . Liver cancer Maternal Grandmother   . Colon  cancer Neg Hx     Social History Social History   Tobacco Use  . Smoking status: Former Smoker    Packs/day: 1.00    Types: Cigarettes    Last attempt to quit: 04/26/2018    Years since quitting: 0.5  . Smokeless tobacco: Never Used  Substance Use Topics  . Alcohol use: No  . Drug use: No     Allergies   Anaprox [naproxen sodium] and Tramadol   Review of Systems Review of Systems  Constitutional: Negative for fever.  Musculoskeletal:       Right shoulder pain  Neurological: Negative for weakness and numbness.  All other systems reviewed and are negative.    Physical Exam Updated Vital Signs BP 104/65 (BP Location: Right Arm)   Pulse 73   Temp 97.6 F (36.4 C) (Oral)   Resp 18   Ht 1.575 m (5\' 2" )   Wt 52.2 kg   SpO2 100%   BMI 21.03 kg/m   Physical Exam  Constitutional: She is oriented to person, place, and time. She appears well-developed and well-nourished. No distress.  HENT:  Head: Normocephalic and atraumatic.  Neck: Neck supple.  Cardiovascular: Normal rate and regular rhythm.  Pulmonary/Chest: Effort normal. No respiratory distress.  Abdominal: Soft. There is no tenderness.  Musculoskeletal:  Normal range of motion of the right shoulder but pain with abduction to 90 degrees, no clavicular or humeral head deformities, joint appears in place, no effusions or overlying skin changes, 2+ radial pulse, neurovascularly intact distally  Neurological: She is alert and oriented to person, place, and time.  Skin: Skin is warm and dry.  Psychiatric: She has a normal mood and affect.  Nursing note and vitals reviewed.    ED Treatments / Results  Labs (all labs ordered are listed, but only abnormal results are displayed) Labs Reviewed - No data to display  EKG None  Radiology Dg Shoulder Right  Result Date: 10/27/2018 CLINICAL DATA:  Initial evaluation for acute right shoulder pain for 2 days. No injury. EXAM: RIGHT SHOULDER - 2+ VIEW COMPARISON:   None. FINDINGS: There is no evidence of fracture or dislocation. There is no evidence of arthropathy or other focal bone abnormality. Soft tissues are unremarkable. IMPRESSION: Negative. Electronically Signed   By: Jeannine Boga M.D.   On: 10/27/2018 04:20    Procedures Procedures (including critical care time)  Medications Ordered in ED Medications  ketorolac (TORADOL) 30 MG/ML injection 30 mg (30 mg Intramuscular Given 10/27/18 0422)     Initial Impression / Assessment and Plan / ED Course  I have reviewed the triage vital signs and the nursing notes.  Pertinent  labs & imaging results that were available during my care of the patient were reviewed by me and considered in my medical decision making (see chart for details).     Patient presents with right shoulder pain.  Denies specific injury but does have a job with repetitive motion.  She has no indications on exam to suggest septic joint or arthritis.  Could be rotator cuff injury or overuse injury.  X-rays negative for acute fracture.  She is neurovascular intact.  Will treat supportively with scheduled anti-inflammatories.  Sports medicine follow-up provided.  After history, exam, and medical workup I feel the patient has been appropriately medically screened and is safe for discharge home. Pertinent diagnoses were discussed with the patient. Patient was given return precautions.   Final Clinical Impressions(s) / ED Diagnoses   Final diagnoses:  Acute pain of right shoulder    ED Discharge Orders         Ordered    ibuprofen (ADVIL,MOTRIN) 600 MG tablet  Every 6 hours PRN     10/27/18 0428           Merryl Hacker, MD 10/27/18 [phone]

## 2018-10-27 NOTE — ED Triage Notes (Signed)
R shoulder pain x 2 days. Pt states she feels it is from repetitive movements at work. No changes to sensations. Full ROM. Denies other injury.

## 2018-10-29 DIAGNOSIS — G894 Chronic pain syndrome: Secondary | ICD-10-CM | POA: Diagnosis not present

## 2018-10-29 DIAGNOSIS — M545 Low back pain: Secondary | ICD-10-CM | POA: Diagnosis not present

## 2018-10-29 DIAGNOSIS — M25511 Pain in right shoulder: Secondary | ICD-10-CM | POA: Diagnosis not present

## 2018-12-18 DIAGNOSIS — G894 Chronic pain syndrome: Secondary | ICD-10-CM | POA: Diagnosis not present

## 2018-12-18 DIAGNOSIS — M545 Low back pain: Secondary | ICD-10-CM | POA: Diagnosis not present

## 2018-12-18 DIAGNOSIS — Z79899 Other long term (current) drug therapy: Secondary | ICD-10-CM | POA: Diagnosis not present

## 2018-12-18 DIAGNOSIS — M25511 Pain in right shoulder: Secondary | ICD-10-CM | POA: Diagnosis not present

## 2018-12-26 DIAGNOSIS — M25511 Pain in right shoulder: Secondary | ICD-10-CM | POA: Diagnosis not present

## 2018-12-30 DIAGNOSIS — M13811 Other specified arthritis, right shoulder: Secondary | ICD-10-CM | POA: Diagnosis not present

## 2018-12-30 DIAGNOSIS — M25511 Pain in right shoulder: Secondary | ICD-10-CM | POA: Diagnosis not present

## 2019-02-17 DIAGNOSIS — M19011 Primary osteoarthritis, right shoulder: Secondary | ICD-10-CM | POA: Diagnosis not present

## 2019-02-17 DIAGNOSIS — M25511 Pain in right shoulder: Secondary | ICD-10-CM | POA: Diagnosis not present

## 2019-03-31 DIAGNOSIS — M25511 Pain in right shoulder: Secondary | ICD-10-CM | POA: Diagnosis not present

## 2019-03-31 DIAGNOSIS — M19011 Primary osteoarthritis, right shoulder: Secondary | ICD-10-CM | POA: Diagnosis not present

## 2019-04-07 DIAGNOSIS — F410 Panic disorder [episodic paroxysmal anxiety] without agoraphobia: Secondary | ICD-10-CM | POA: Diagnosis not present

## 2019-04-07 DIAGNOSIS — F331 Major depressive disorder, recurrent, moderate: Secondary | ICD-10-CM | POA: Diagnosis not present

## 2019-05-20 DIAGNOSIS — G894 Chronic pain syndrome: Secondary | ICD-10-CM | POA: Diagnosis not present

## 2019-05-20 DIAGNOSIS — Z79899 Other long term (current) drug therapy: Secondary | ICD-10-CM | POA: Diagnosis not present

## 2019-09-08 DIAGNOSIS — Z20828 Contact with and (suspected) exposure to other viral communicable diseases: Secondary | ICD-10-CM | POA: Diagnosis not present

## 2019-11-02 DIAGNOSIS — Z79899 Other long term (current) drug therapy: Secondary | ICD-10-CM | POA: Diagnosis not present

## 2019-11-02 DIAGNOSIS — M5416 Radiculopathy, lumbar region: Secondary | ICD-10-CM | POA: Diagnosis not present

## 2019-11-02 DIAGNOSIS — M545 Low back pain: Secondary | ICD-10-CM | POA: Diagnosis not present

## 2021-04-03 ENCOUNTER — Other Ambulatory Visit: Payer: Self-pay

## 2021-04-03 ENCOUNTER — Emergency Department (HOSPITAL_BASED_OUTPATIENT_CLINIC_OR_DEPARTMENT_OTHER)
Admission: EM | Admit: 2021-04-03 | Discharge: 2021-04-03 | Disposition: A | Discharge status: Home / Self Care | Payer: Federal, State, Local not specified - PPO | Attending: Emergency Medicine | Admitting: Emergency Medicine

## 2021-04-03 ENCOUNTER — Encounter (HOSPITAL_BASED_OUTPATIENT_CLINIC_OR_DEPARTMENT_OTHER): Payer: Self-pay | Admitting: *Deleted

## 2021-04-03 DIAGNOSIS — Z87891 Personal history of nicotine dependence: Secondary | ICD-10-CM | POA: Diagnosis not present

## 2021-04-03 DIAGNOSIS — H53149 Visual discomfort, unspecified: Secondary | ICD-10-CM | POA: Insufficient documentation

## 2021-04-03 DIAGNOSIS — R519 Headache, unspecified: Secondary | ICD-10-CM | POA: Insufficient documentation

## 2021-04-03 LAB — CBC WITH DIFFERENTIAL/PLATELET
Abs Immature Granulocytes: 0.01 10*3/uL (ref 0.00–0.07)
Basophils Absolute: 0.1 10*3/uL (ref 0.0–0.1)
Basophils Relative: 1 %
Eosinophils Absolute: 0.1 10*3/uL (ref 0.0–0.5)
Eosinophils Relative: 1 %
HCT: 39.2 % (ref 36.0–46.0)
Hemoglobin: 13.4 g/dL (ref 12.0–15.0)
Immature Granulocytes: 0 %
Lymphocytes Relative: 39 %
Lymphs Abs: 2.5 10*3/uL (ref 0.7–4.0)
MCH: 32.1 pg (ref 26.0–34.0)
MCHC: 34.2 g/dL (ref 30.0–36.0)
MCV: 93.8 fL (ref 80.0–100.0)
Monocytes Absolute: 0.4 10*3/uL (ref 0.1–1.0)
Monocytes Relative: 6 %
Neutro Abs: 3.5 10*3/uL (ref 1.7–7.7)
Neutrophils Relative %: 53 %
Platelets: 304 10*3/uL (ref 150–400)
RBC: 4.18 MIL/uL (ref 3.87–5.11)
RDW: 14 % (ref 11.5–15.5)
WBC: 6.5 10*3/uL (ref 4.0–10.5)
nRBC: 0 % (ref 0.0–0.2)

## 2021-04-03 LAB — BASIC METABOLIC PANEL
Anion gap: 9 (ref 5–15)
BUN: 20 mg/dL (ref 8–23)
CO2: 24 mmol/L (ref 22–32)
Calcium: 9.5 mg/dL (ref 8.9–10.3)
Chloride: 104 mmol/L (ref 98–111)
Creatinine, Ser: 0.7 mg/dL (ref 0.44–1.00)
GFR, Estimated: >60 mL/min (ref >60)
Glucose, Bld: 88 mg/dL (ref 70–99)
Potassium: 3.9 mmol/L (ref 3.5–5.1)
Sodium: 137 mmol/L (ref 135–145)

## 2021-04-03 LAB — SEDIMENTATION RATE: Sed Rate: 18 mm/hr (ref 0–22)

## 2021-04-03 LAB — C-REACTIVE PROTEIN: CRP: 0.6 mg/dL (ref <1.0)

## 2021-04-03 MED ORDER — KETOROLAC TROMETHAMINE 15 MG/ML IJ SOLN
15.0000 mg | Freq: Once | INTRAMUSCULAR | Status: AC
  Administered 2021-04-03: 15 mg via INTRAVENOUS
  Filled 2021-04-03: qty 1

## 2021-04-03 MED ORDER — METOCLOPRAMIDE HCL 5 MG/ML IJ SOLN
10.0000 mg | Freq: Once | INTRAMUSCULAR | Status: AC
  Administered 2021-04-03: 10 mg via INTRAVENOUS
  Filled 2021-04-03: qty 2

## 2021-04-03 MED ORDER — LACTATED RINGERS IV BOLUS
1000.0000 mL | Freq: Once | INTRAVENOUS | Status: AC
  Administered 2021-04-03: 1000 mL via INTRAVENOUS

## 2021-04-03 MED ORDER — DEXAMETHASONE SODIUM PHOSPHATE 10 MG/ML IJ SOLN
10.0000 mg | Freq: Once | INTRAMUSCULAR | Status: AC
  Administered 2021-04-03: 10 mg via INTRAVENOUS
  Filled 2021-04-03: qty 1

## 2021-04-03 NOTE — ED Provider Notes (Signed)
[facility] Provider Note   CSN: [id number] Arrival date & time: 04/03/21  1651     History Chief Complaint  Patient presents with  . Headache    Angela Pineda is a 60 y.o. female.  HPI      Angela Pineda is a 60 y.o. female, with a history of anemia, arthritis, migraines, presenting to the ED with headache for the past week.   Pain is left-sided, throbbing, moderate, nonradiating.  Accompanied by sensitivity to light and sound. She states she has tried ibuprofen and Tylenol without improvement.  She has had similar headaches in the past diagnosed with migraines, but states she will typically have a year or 2 in between migraines. States she called her PCP office and was told to come to the ED. Denies fever/chills, neck pain/stiffness, vision loss, numbness, weakness, difficulty swallowing, diplopia, changes in headache with position or movement of the neck, syncope, dizziness, or any other complaints.    Past Medical History:  Diagnosis Date  . Anemia   . Arthritis    back,neck  . Bronchitis   . Chronic back pain   . Chronic headaches   . Colon polyps    per patient recollection-in [place]  . Depression   . Neck pain, chronic   . Nerve damage   . Panic attacks     Patient Active Problem List   Diagnosis Date Noted  . Diarrhea 01/20/2017  . Normocytic anemia 01/20/2017  . Dehydration 01/19/2017  . Radicular low back pain 08/06/2013    Past Surgical History:  Procedure Laterality Date  . ABLATION ON ENDOMETRIOSIS    . BREAST CYST EXCISION Right   . COLONOSCOPY    . HEMORRHOID SURGERY    . KIDNEY SURGERY    . NECK SURGERY  2002  . POLYPECTOMY    . TONSILLECTOMY       OB History   No obstetric history on file.     Family History  Problem Relation Age of Onset  . Hypertension Father   . Lung cancer Mother   . Emphysema Mother        Smoker  . Lung cancer Brother   . Liver cancer Maternal Grandmother   .  Colon cancer Neg Hx     Social History   Tobacco Use  . Smoking status: Former Smoker    Packs/day: 1.00    Types: Cigarettes    Quit date: 04/26/2018    Years since quitting: 2.9  . Smokeless tobacco: Never Used  Vaping Use  . Vaping Use: Some days  Substance Use Topics  . Alcohol use: No  . Drug use: No    Home Medications Prior to Admission medications   Medication Sig Start Date End Date Taking? Authorizing Provider  amoxicillin-clavulanate (AUGMENTIN) 875-125 MG tablet Take 1 tablet by mouth every 12 (twelve) hours. 05/10/17   Julianne Rice, MD  brimonidine (ALPHAGAN P) 0.15 % ophthalmic solution Place 1 drop into the right eye 3 (three) times daily. 12/12/17   Mackuen, Courteney Lyn, MD  clonazePAM (KLONOPIN) 0.5 MG tablet Take 0.5 mg by mouth 2 (two) times daily as needed for anxiety.    [provider]  cyclobenzaprine (FLEXERIL) 10 MG tablet Take 1 tablet (10 mg total) by mouth 2 (two) times daily as needed for muscle spasms. 12/08/17   Doristine Devoid, PA-C  dicyclomine (BENTYL) 20 MG tablet Take 1 tablet (20 mg total) by mouth 2 (two) times daily. 02/07/18  Long, Wonda Olds, MD  dorzolamide-timolol (COSOPT) 22.3-6.8 MG/ML ophthalmic solution Place 1 drop into the right eye 2 (two) times daily. 12/12/17   Mackuen, Courteney Lyn, MD  HYDROcodone-acetaminophen (NORCO/VICODIN) 5-325 MG per tablet Take 1-2 tablets by mouth every 4 (four) hours as needed for moderate pain. 08/23/14   Hazel Sams, PA-C  ibuprofen (ADVIL,MOTRIN) 600 MG tablet Take 1 tablet (600 mg total) by mouth every 6 (six) hours as needed. 10/27/18   Horton, Barbette Hair, MD  loperamide (IMODIUM) 2 MG capsule Take 1 capsule (2 mg total) by mouth 4 (four) times daily as needed for diarrhea or loose stools. 02/07/18   Margette Fast, MD  methylPREDNISolone (MEDROL DOSEPAK) 4 MG TBPK tablet Take as directed 12/08/17   Ocie Cornfield T, PA-C  omeprazole (PRILOSEC) 40 MG capsule Take 1 capsule (40 mg  total) by mouth daily. Patient not taking: No sig reported 01/29/17   Saguier, Percell Miller, PA-C  ondansetron (ZOFRAN ODT) 4 MG disintegrating tablet Take 1 tablet (4 mg total) by mouth every 8 (eight) hours as needed for nausea or vomiting. 02/07/18   Long, Wonda Olds, MD  ondansetron (ZOFRAN) 4 MG tablet Take 1 tablet (4 mg total) by mouth every 6 (six) hours as needed for nausea or vomiting. Patient not taking: No sig reported 02/04/17   Levin Erp, PA    Allergies    Anaprox [naproxen sodium] and Tramadol  Review of Systems   Review of Systems  Constitutional: Negative for chills, diaphoresis and fever.  HENT: Negative for trouble swallowing.   Eyes: Positive for photophobia. Negative for pain.  Respiratory: Negative for shortness of breath.   Cardiovascular: Negative for chest pain.  Gastrointestinal: Positive for nausea. Negative for abdominal pain, diarrhea and vomiting.  Musculoskeletal: Negative for neck pain and neck stiffness.  Neurological: Positive for headaches. Negative for dizziness, seizures, syncope, facial asymmetry, speech difficulty, weakness, light-headedness and numbness.  Psychiatric/Behavioral: Negative for confusion.  All other systems reviewed and are negative.   Physical Exam Updated Vital Signs BP 107/75   Pulse 82   Temp 98.8 F (37.1 C) (Oral)   Resp 16   Ht _0  (1.575 m)   Wt 54.4 kg   SpO2 98%   BMI 21.95 kg/m   Physical Exam Vitals and nursing note reviewed.  Constitutional:      General: She is not in acute distress.    Appearance: She is well-developed. She is not diaphoretic.  HENT:     Head: Normocephalic and atraumatic.     Comments: Patient initially indicates she has tenderness over the left temple, however, seems to be minor on exam.  There is no swelling, erythema, or ecchymosis. She seems to have equal pulses in the temporal artery by palpation.    Mouth/Throat:     Mouth: Mucous membranes are moist.     Pharynx:  Oropharynx is clear.  Eyes:     Conjunctiva/sclera: Conjunctivae normal.  Neck:     Vascular: No carotid bruit.     Comments: No changes in her symptoms with movement of her neck. Cardiovascular:     Rate and Rhythm: Normal rate and regular rhythm.     Pulses: Normal pulses.          Radial pulses are 2+ on the right side and 2+ on the left side.       Posterior tibial pulses are 2+ on the right side and 2+ on the left side.     Comments: Tactile temperature  in the extremities appropriate and equal bilaterally. Pulmonary:     Effort: Pulmonary effort is normal. No respiratory distress.  Abdominal:     Tenderness: There is no guarding.  Musculoskeletal:     Cervical back: Normal range of motion and neck supple. No rigidity or tenderness.  Lymphadenopathy:     Cervical: No cervical adenopathy.  Skin:    General: Skin is warm and dry.  Neurological:     Mental Status: She is alert and oriented to person, place, and time.     Comments: No noted acute cognitive deficit. Sensation grossly intact to light touch in the extremities.   Grip strengths equal bilaterally.   Strength 5/5 in all extremities.  No gait disturbance.  Coordination intact.  Cranial nerves III-XII grossly intact.  Handles oral secretions without noted difficulty.  No noted phonation or speech deficit. No facial droop.   Psychiatric:        Mood and Affect: Mood and affect normal.        Speech: Speech normal.        Behavior: Behavior normal.     ED Results / Procedures / Treatments   Labs (all labs ordered are listed, but only abnormal results are displayed) Labs Reviewed  BASIC METABOLIC PANEL  CBC WITH DIFFERENTIAL/PLATELET  SEDIMENTATION RATE  C-REACTIVE PROTEIN    EKG None  Radiology No results found.  Procedures Procedures   Medications Ordered in ED Medications  lactated ringers bolus 1,000 mL (0 mLs Intravenous Stopped 04/03/21 2103)  ketorolac (TORADOL) 15 MG/ML injection 15 mg (15 mg  Intravenous Given 04/03/21 1933)  metoCLOPramide (REGLAN) injection 10 mg (10 mg Intravenous Given 04/03/21 1932)  dexamethasone (DECADRON) injection 10 mg (10 mg Intravenous Given 04/03/21 1931)    ED Course  I have reviewed the triage vital signs and the nursing notes.  Pertinent labs & imaging results that were available during my care of the patient were reviewed by me and considered in my medical decision making (see chart for details).    MDM Rules/Calculators/A&P                          Patient presents with left-sided headache.  No focal neurologic deficits. Patient is nontoxic appearing, afebrile, not tachycardic, not tachypneic, not hypotensive, maintains excellent SPO2 on room air, and is in no apparent distress.   I have reviewed the patient's chart to obtain more information.   I reviewed and interpreted the patient's labs. She has no elevation in her ESR.  Equal palpable temporal pulses on exam. She should still follow-up on this matter with her PCP.  She had complete resolution in her symptoms.  She no longer had tenderness or pain over the left temple. The patient was given instructions for home care as well as return precautions. Patient voices understanding of these instructions, accepts the plan, and is comfortable with discharge.   Vitals:   04/03/21 1820 04/03/21 1822 04/03/21 1924 04/03/21 2033  BP: 107/75  118/82 109/79  Pulse: 86 82 82 86  Resp: _0 Temp:      TempSrc:      SpO2: 97% 98% 97% 96%  Weight:      Height:         Final Clinical Impression(s) / ED Diagnoses Final diagnoses:  Left-sided headache    Rx / DC Orders ED Discharge Orders    None       Zelie Asbill C,  PA-C 04/03/21 2135    Blanchie Dessert, MD 04/04/21 1531

## 2021-04-03 NOTE — ED Triage Notes (Signed)
Headache for a week.

## 2021-04-03 NOTE — Discharge Instructions (Addendum)
Headache Your lab results showed no acute abnormalities.  For future headaches please try the following regimen: Antiinflammatory medications: Take 600 mg of ibuprofen every 6 hours or 440 mg (over the counter dose) to 500 mg (prescription dose) of naproxen every 12 hours.  Use this regimen until headache subsides for up to 3 days .  After this time, these medications may be used as needed for pain. Take these medications with food to avoid upset stomach. Choose only one of these medications, do not take them together. Acetaminophen: Should you continue to have additional pain while taking the ibuprofen or naproxen, you may add in acetaminophen (generic for Tylenol) as needed. Your daily total maximum amount of acetaminophen from all sources should be limited to 4000mg /day for persons without liver problems, or 2000mg /day for those with liver problems.  Hydration: Have a goal of about a half liter of water every couple hours to stay well hydrated.   Sleep: Please be sure to get plenty of sleep with a goal of 8 hours per night. Having a regular bed time and bedtime routine can help with this.  Screens: Reduce the amount of time you are in front of screens.  Take about a 5-10-minute break every hour or every couple hours to give your eyes rest.  Do not use screens in dark rooms.  Glasses with a blue light filter may also help reduce eye fatigue.  Stress: Take steps to reduce stress as much as possible.   Follow up: Follow-up with your primary care provider on this issue.  May also need to follow-up with the neurologist for increased frequency of headaches.  There are 2 lab tests that are still pending that further evaluate source of headache.  Please check back in MyChart for the results of these lab tests.  These will need to be followed up on by a primary care provider.

## 2021-04-03 NOTE — ED Notes (Signed)
Assumed care of this patient. Vitals taken. A&Ox4. Respirations regular/unlabored. PIV obtained. Medicated per MAR. Connected to BP and pulse ox. Stretcher low, wheels locked, call bell within reach.

## 2021-08-10 ENCOUNTER — Other Ambulatory Visit: Payer: Self-pay

## 2021-08-10 ENCOUNTER — Encounter (HOSPITAL_BASED_OUTPATIENT_CLINIC_OR_DEPARTMENT_OTHER): Payer: Self-pay

## 2021-08-10 ENCOUNTER — Emergency Department (HOSPITAL_BASED_OUTPATIENT_CLINIC_OR_DEPARTMENT_OTHER)
Admission: EM | Admit: 2021-08-10 | Discharge: 2021-08-10 | Disposition: A | Discharge status: Home / Self Care | Payer: Federal, State, Local not specified - PPO | Attending: Emergency Medicine | Admitting: Emergency Medicine

## 2021-08-10 ENCOUNTER — Emergency Department (HOSPITAL_BASED_OUTPATIENT_CLINIC_OR_DEPARTMENT_OTHER): Payer: Federal, State, Local not specified - PPO

## 2021-08-10 DIAGNOSIS — E119 Type 2 diabetes mellitus without complications: Secondary | ICD-10-CM | POA: Diagnosis not present

## 2021-08-10 DIAGNOSIS — Z87891 Personal history of nicotine dependence: Secondary | ICD-10-CM | POA: Diagnosis not present

## 2021-08-10 DIAGNOSIS — M79602 Pain in left arm: Secondary | ICD-10-CM | POA: Insufficient documentation

## 2021-08-10 MED ORDER — METHOCARBAMOL 500 MG PO TABS: 500.0000 mg | ORAL_TABLET | ORAL | 0 refills | Status: AC | PRN

## 2021-08-10 MED ORDER — DICLOFENAC SODIUM 1 % EX GEL: 2.0000 g | Freq: Four times a day (QID) | CUTANEOUS | 0 refills | Status: AC | PRN

## 2021-08-10 MED ORDER — PREDNISONE 10 MG PO TABS: 10.0000 mg | ORAL_TABLET | Freq: Every day | ORAL | 0 refills | Status: AC

## 2021-08-10 MED ORDER — CYCLOBENZAPRINE HCL 5 MG PO TABS
5.0000 mg | ORAL_TABLET | Freq: Once | ORAL | Status: AC
  Administered 2021-08-10: 5 mg via ORAL
  Filled 2021-08-10: qty 1

## 2021-08-10 NOTE — Discharge Instructions (Addendum)
He was seen in the emergency department at [facility] for left arm pain.  While you were here we did x-ray imaging of your shoulder and upper arm.  Both x-rays showed no fractures, dislocation, or soft tissue swelling.  The information you provided along with my physical exam and x-ray imaging is reassuring and we feel you are safe for discharge home at this time.  As I discussed with you at bedside we will be prescribing you a short course of steroids to reduce inflammation around the shoulder.  Additionally we are to prescribe you a medication called Robaxin which is a muscle relaxant.  Please use this medication only as needed while you are having pain.  Please do not operate a motor vehicle or consume alcohol with this drug.  Finally we are going to give you a prescription for topical diclofenac gel.  This is a topical NSAID.  We discussed at bedside that you are allergic to naproxen however you have taken ibuprofen without issue before.  Additionally I have put in a referral for you to follow-up with Clearance Coots, who is a sports medicine/orthopedic specialist.  Please feel free to return to emergency department for any reason. Thank you for trusting [place] with your care.

## 2021-08-10 NOTE — ED Provider Notes (Signed)
[facility] Provider Note   CSN: [id number] Arrival date & time: 08/10/21  1610     History Chief Complaint  Patient presents with   Arm Pain    Angela Pineda is a 60 y.o. female.  With past medical history of arthritis, chronic back pain, who presents to the emergency department for left arm pain.  States that arm pain began 2 weeks ago.  Describes it as a pain in her proximal left arm and bicep.  She also points to the left shoulder.  States that it has been gradually worsening over the past 2 weeks.  Denies trauma or falls.  States that it is a constant, sharp pain.  States she does repetitive motions that work.  She denies trying anything to make the pain better.  Movement makes it worse.  She works at the [employer].  States that she does not lift over 20 pounds at work.  States that she decided to come to the emergency department because the pain had not abated.  She is right-handed.  She denies numbness or tingling in the affected arm or hand.  Denies any change of color or coolness to touch of the arm.  No swelling, erythema, heat to the arm.  Denies chest pain, shortness of breath or cough.  Denies IV drug use. Denies history of diabetes.  Denies previous surgery to the arm denies previous injuries to the arm.  The history is provided by the patient.  Arm Pain This is a new problem. The current episode started more than 1 week ago. The problem occurs constantly. The problem has been gradually worsening. Nothing relieves the symptoms. She has tried nothing for the symptoms.   Past Medical History:  Diagnosis Date   Anemia    Arthritis    back,neck   Bronchitis    Chronic back pain    Chronic headaches    Colon polyps    per patient recollection-in [place]   Depression    Neck pain, chronic    Nerve damage    Panic attacks     Patient Active Problem List   Diagnosis Date Noted   Diarrhea 01/20/2017   Normocytic anemia 01/20/2017    Dehydration 01/19/2017   Radicular low back pain 08/06/2013    Past Surgical History:  Procedure Laterality Date   ABLATION ON ENDOMETRIOSIS     BREAST CYST EXCISION Right    COLONOSCOPY     HEMORRHOID SURGERY     KIDNEY SURGERY     NECK SURGERY  2002   POLYPECTOMY     TONSILLECTOMY       OB History   No obstetric history on file.     Family History  Problem Relation Age of Onset   Hypertension Father    Lung cancer Mother    Emphysema Mother        Smoker   Lung cancer Brother    Liver cancer Maternal Grandmother    Colon cancer Neg Hx     Social History   Tobacco Use   Smoking status: Former    Packs/day: 1.00    Types: Cigarettes    Quit date: 04/26/2018    Years since quitting: 3.2   Smokeless tobacco: Never  Vaping Use   Vaping Use: Some days  Substance Use Topics   Alcohol use: No   Drug use: No    Home Medications Prior to Admission medications   Medication Sig Start Date End Date  Taking? Authorizing Provider  amoxicillin-clavulanate (AUGMENTIN) 875-125 MG tablet Take 1 tablet by mouth every 12 (twelve) hours. 05/10/17   Julianne Rice, MD  brimonidine (ALPHAGAN P) 0.15 % ophthalmic solution Place 1 drop into the right eye 3 (three) times daily. 12/12/17   Mackuen, Courteney Lyn, MD  clonazePAM (KLONOPIN) 0.5 MG tablet Take 0.5 mg by mouth 2 (two) times daily as needed for anxiety.    [provider]  cyclobenzaprine (FLEXERIL) 10 MG tablet Take 1 tablet (10 mg total) by mouth 2 (two) times daily as needed for muscle spasms. 12/08/17   Doristine Devoid, PA-C  dicyclomine (BENTYL) 20 MG tablet Take 1 tablet (20 mg total) by mouth 2 (two) times daily. 02/07/18   Long, Wonda Olds, MD  dorzolamide-timolol (COSOPT) 22.3-6.8 MG/ML ophthalmic solution Place 1 drop into the right eye 2 (two) times daily. 12/12/17   Mackuen, Courteney Lyn, MD  HYDROcodone-acetaminophen (NORCO/VICODIN) 5-325 MG per tablet Take 1-2 tablets by mouth every 4 (four) hours  as needed for moderate pain. 08/23/14   Hazel Sams, PA-C  ibuprofen (ADVIL,MOTRIN) 600 MG tablet Take 1 tablet (600 mg total) by mouth every 6 (six) hours as needed. 10/27/18   Horton, Barbette Hair, MD  loperamide (IMODIUM) 2 MG capsule Take 1 capsule (2 mg total) by mouth 4 (four) times daily as needed for diarrhea or loose stools. 02/07/18   Margette Fast, MD  methylPREDNISolone (MEDROL DOSEPAK) 4 MG TBPK tablet Take as directed 12/08/17   Ocie Cornfield T, PA-C  omeprazole (PRILOSEC) 40 MG capsule Take 1 capsule (40 mg total) by mouth daily. Patient not taking: No sig reported 01/29/17   Saguier, Percell Miller, PA-C  ondansetron (ZOFRAN ODT) 4 MG disintegrating tablet Take 1 tablet (4 mg total) by mouth every 8 (eight) hours as needed for nausea or vomiting. 02/07/18   Long, Wonda Olds, MD  ondansetron (ZOFRAN) 4 MG tablet Take 1 tablet (4 mg total) by mouth every 6 (six) hours as needed for nausea or vomiting. Patient not taking: No sig reported 02/04/17   Levin Erp, PA    Allergies    Anaprox [naproxen sodium] and Tramadol  Review of Systems   Review of Systems  Constitutional: Negative.   Cardiovascular: Negative.   Musculoskeletal:  Positive for arthralgias and myalgias. Negative for joint swelling.  Skin: Negative.   Neurological: Negative.   All other systems reviewed and are negative.  Physical Exam Updated Vital Signs BP (!) 111/96 (BP Location: Right Arm)   Pulse 89   Temp 98.7 F (37.1 C) (Oral)   Resp 18   Ht '5\' 2"'$  (1.575 m)   Wt 59.4 kg   SpO2 99%   BMI 23.96 kg/m   Physical Exam Vitals and nursing note reviewed.  Constitutional:      General: She is not in acute distress.    Appearance: Normal appearance.  HENT:     Head: Normocephalic.  Eyes:     Extraocular Movements: Extraocular movements intact.     Pupils: Pupils are equal, round, and reactive to light.  Cardiovascular:     Pulses: Normal pulses.  Musculoskeletal:        General: Tenderness  present.     Right shoulder: Normal.     Left shoulder: Tenderness and bony tenderness present. Decreased range of motion. Decreased strength.     Right upper arm: Normal.     Left upper arm: Tenderness and bony tenderness present.     Right elbow: Normal.  Left elbow: Normal.     Cervical back: Normal range of motion and neck supple.     Comments: + Yergason, +empty can   Skin:    General: Skin is warm and dry.     Capillary Refill: Capillary refill takes less than 2 seconds.  Neurological:     General: No focal deficit present.     Mental Status: She is alert and oriented to person, place, and time. Mental status is at baseline.     Sensory: No sensory deficit.    ED Results / Procedures / Treatments   Labs (all labs ordered are listed, but only abnormal results are displayed) Labs Reviewed - No data to display  EKG None  Radiology DG Shoulder Left  Result Date: 08/10/2021 CLINICAL DATA:  Pain. Pain to the left upper arm for 2 weeks. No injury. Limited movement. EXAM: LEFT HUMERUS - 2+ VIEW; LEFT SHOULDER - 2+ VIEW COMPARISON:  None. FINDINGS: No evidence of acute fracture or dislocation involving the left shoulder or left humerus. No focal bone lesion or bone destruction. Coracoclavicular and acromioclavicular spaces are normal. Soft tissues are unremarkable. IMPRESSION: Negative. Electronically Signed   By: Lucienne Capers M.D.   On: 08/10/2021 18:33   DG Humerus Left  Result Date: 08/10/2021 CLINICAL DATA:  Pain. Pain to the left upper arm for 2 weeks. No injury. Limited movement. EXAM: LEFT HUMERUS - 2+ VIEW; LEFT SHOULDER - 2+ VIEW COMPARISON:  None. FINDINGS: No evidence of acute fracture or dislocation involving the left shoulder or left humerus. No focal bone lesion or bone destruction. Coracoclavicular and acromioclavicular spaces are normal. Soft tissues are unremarkable. IMPRESSION: Negative. Electronically Signed   By: Lucienne Capers M.D.   On: 08/10/2021 18:33     Procedures Procedures   Medications Ordered in ED Medications  cyclobenzaprine (FLEXERIL) tablet 5 mg (has no administration in time range)   ED Course  I have reviewed the triage vital signs and the nursing notes.  Pertinent labs & imaging results that were available during my care of the patient were reviewed by me and considered in my medical decision making (see chart for details).  Angela Pineda 60 year old female of present emergency department with left arm pain.  ED course includes imaging of the left humerus and shoulder.  Imaging was negative for any acute fractures, dislocations, soft tissue swelling.  Differential diagnosis initially included fractures, soft tissue injury including rotator cuff tear, dislocation, radiculopathy, sprain or strain of the shoulder or elbow joint, biceps tendinopathy, adhesive capsulitis.  Imaging able to rule out acute fracture or dislocations.  Patient does have a history of diabetes, however has been limiting use of the left arm since pain is begun increasing suspicion for adhesive capsulitis.  Additionally had positive Yergason on exam concerning for biceps tendinopathy.   She has no neurological findings concerning for radiculopathy. The arm is warm, without pallor, equal pulses lowering concern for embolization.  After history, physical exam and imaging the patient's work-up is reassuring and would benefit from outpatient orthopedic follow-up.  She is hemodynamically stable and amenable to discharge with a short course of steroids, Robaxin, Voltaren gel.  She is agreeable to follow-up with outpatient sports medicine. MDM Rules/Calculators/A&P  Final Clinical Impression(s) / ED Diagnoses Final diagnoses:  Left arm pain    Rx / DC Orders ED Discharge Orders          Ordered    predniSONE (DELTASONE) 10 MG tablet  Daily  08/10/21 1859    methocarbamol (ROBAXIN) 500 MG tablet  As needed        08/10/21 1859    diclofenac Sodium  (VOLTAREN) 1 % GEL  4 times daily PRN        08/10/21 1859             Mickie Hillier, PA-C 08/10/21 1907    Drenda Freeze, MD 08/16/21 2130

## 2021-08-10 NOTE — ED Triage Notes (Signed)
Pt c/o pain to left upper arm x 2 weeks-denies injury-states pain is worse with movement-NAD-steady gait

## 2023-11-17 ENCOUNTER — Emergency Department (HOSPITAL_BASED_OUTPATIENT_CLINIC_OR_DEPARTMENT_OTHER): Payer: Federal, State, Local not specified - PPO

## 2023-11-17 ENCOUNTER — Other Ambulatory Visit: Payer: Self-pay

## 2023-11-17 ENCOUNTER — Encounter (HOSPITAL_BASED_OUTPATIENT_CLINIC_OR_DEPARTMENT_OTHER): Payer: Self-pay

## 2023-11-17 ENCOUNTER — Emergency Department (HOSPITAL_BASED_OUTPATIENT_CLINIC_OR_DEPARTMENT_OTHER)
Admission: EM | Admit: 2023-11-17 | Discharge: 2023-11-17 | Disposition: A | Discharge status: Home / Self Care | Payer: Federal, State, Local not specified - PPO | Attending: Emergency Medicine | Admitting: Emergency Medicine

## 2023-11-17 DIAGNOSIS — R109 Unspecified abdominal pain: Secondary | ICD-10-CM | POA: Insufficient documentation

## 2023-11-17 DIAGNOSIS — R110 Nausea: Secondary | ICD-10-CM | POA: Diagnosis not present

## 2023-11-17 DIAGNOSIS — M546 Pain in thoracic spine: Secondary | ICD-10-CM | POA: Diagnosis not present

## 2023-11-17 LAB — CBC WITH DIFFERENTIAL/PLATELET
Abs Immature Granulocytes: 0.02 10*3/uL (ref 0.00–0.07)
Basophils Absolute: 0.1 10*3/uL (ref 0.0–0.1)
Basophils Relative: 1 %
Eosinophils Absolute: 0.2 10*3/uL (ref 0.0–0.5)
Eosinophils Relative: 2 %
HCT: 38.9 % (ref 36.0–46.0)
Hemoglobin: 13.3 g/dL (ref 12.0–15.0)
Immature Granulocytes: 0 %
Lymphocytes Relative: 35 %
Lymphs Abs: 2.6 10*3/uL (ref 0.7–4.0)
MCH: 31.6 pg (ref 26.0–34.0)
MCHC: 34.2 g/dL (ref 30.0–36.0)
MCV: 92.4 fL (ref 80.0–100.0)
Monocytes Absolute: 0.6 10*3/uL (ref 0.1–1.0)
Monocytes Relative: 8 %
Neutro Abs: 4 10*3/uL (ref 1.7–7.7)
Neutrophils Relative %: 54 %
Platelets: 278 10*3/uL (ref 150–400)
RBC: 4.21 MIL/uL (ref 3.87–5.11)
RDW: 13.4 % (ref 11.5–15.5)
WBC: 7.5 10*3/uL (ref 4.0–10.5)
nRBC: 0 % (ref 0.0–0.2)

## 2023-11-17 LAB — I-STAT CHEM 8, ED
BUN: 15 mg/dL (ref 8–23)
Calcium, Ion: 1.25 mmol/L (ref 1.15–1.40)
Chloride: 104 mmol/L (ref 98–111)
Creatinine, Ser: 0.9 mg/dL (ref 0.44–1.00)
Glucose, Bld: 82 mg/dL (ref 70–99)
HCT: 40 % (ref 36.0–46.0)
Hemoglobin: 13.6 g/dL (ref 12.0–15.0)
Potassium: 4.4 mmol/L (ref 3.5–5.1)
Sodium: 137 mmol/L (ref 135–145)
TCO2: 21 mmol/L — ABNORMAL LOW (ref 22–32)

## 2023-11-17 LAB — HEPATIC FUNCTION PANEL
ALT: 19 U/L (ref 0–44)
AST: 22 U/L (ref 15–41)
Albumin: 4.3 g/dL (ref 3.5–5.0)
Alkaline Phosphatase: 87 U/L (ref 38–126)
Bilirubin, Direct: 0.1 mg/dL (ref 0.0–0.2)
Indirect Bilirubin: 0.5 mg/dL (ref 0.3–0.9)
Total Bilirubin: 0.6 mg/dL (ref <1.2)
Total Protein: 6.9 g/dL (ref 6.5–8.1)

## 2023-11-17 LAB — LIPASE, BLOOD: Lipase: 31 U/L (ref 11–51)

## 2023-11-17 LAB — D-DIMER, QUANTITATIVE: D-Dimer, Quant: 0.3 ug{FEU}/mL (ref 0.00–0.50)

## 2023-11-17 MED ORDER — LIDOCAINE 5 % EX PTCH
1.0000 | MEDICATED_PATCH | CUTANEOUS | Status: DC
  Administered 2023-11-17: 1 via TRANSDERMAL
  Filled 2023-11-17: qty 1

## 2023-11-17 MED ORDER — ACETAMINOPHEN 325 MG PO TABS
650.0000 mg | ORAL_TABLET | Freq: Once | ORAL | Status: AC
  Administered 2023-11-17: 650 mg via ORAL
  Filled 2023-11-17: qty 2

## 2023-11-17 MED ORDER — METHOCARBAMOL 500 MG PO TABS: 500.0000 mg | ORAL_TABLET | Freq: Two times a day (BID) | ORAL | 0 refills | Status: AC

## 2023-11-17 NOTE — ED Triage Notes (Signed)
Pt reports L sided flank pain that radiates to back since yesterday. Denies GI or GU symptoms. Complains of Nausea

## 2023-11-17 NOTE — Discharge Instructions (Addendum)
It was a pleasure taking care of you today. As discussed, your work-up was reassuring. Your chest x-ray was normal. Your labs were normal. This could be related to musculoskeletal pain. Continue taking you hydrocodone as previous prescribed. I am also sending you home with a muscle relaxer. Take as needed for pain. Muscle relaxer can cause drowsiness so do not drive or operate machinery while on the medication. Return to the ER for new or worsening symptoms.   As discussed, your CT scan showed a cyst on your back. Follow-up with PCP for further evaluation. There was no evidence of kidney stone on CT scan.

## 2023-11-17 NOTE — ED Provider Notes (Signed)
[facility] Provider Note   CSN: [id number] Arrival date & time: 11/17/23  1628     History  Chief Complaint  Patient presents with   Flank Pain    Angela Pineda is a 62 y.o. female with a past medical history significant for chronic low back pain, chronic pain syndrome, and depression who presents to the ED due to left-sided flank pain that started yesterday.  Pain located in left thoracic paraspinal region.  Pain worse with deep inspiration.  No chest pain or shortness of breath.  No history of blood clots, recent surgeries, recent long immobilizations.  Denies lower extremity edema.  Denies urinary symptoms.  No abdominal pain.  Admits to nausea.  No vomiting or diarrhea.  No fever or chills.  Denies cough and fever.  No injury to area.  No rash. Denies history of kidney stones.  History obtained from patient and past medical records. No interpreter used during encounter.      Home Medications Prior to Admission medications   Medication Sig Start Date End Date Taking? Authorizing Provider  methocarbamol (ROBAXIN) 500 MG tablet Take 1 tablet (500 mg total) by mouth 2 (two) times daily. 11/17/23  Yes Dowell Hoon C, PA-C  amoxicillin-clavulanate (AUGMENTIN) 875-125 MG tablet Take 1 tablet by mouth every 12 (twelve) hours. 05/10/17   Loren Racer, MD  brimonidine (ALPHAGAN P) 0.15 % ophthalmic solution Place 1 drop into the right eye 3 (three) times daily. 12/12/17   Mackuen, Courteney Lyn, MD  clonazePAM (KLONOPIN) 0.5 MG tablet Take 0.5 mg by mouth 2 (two) times daily as needed for anxiety.    [provider]  cyclobenzaprine (FLEXERIL) 10 MG tablet Take 1 tablet (10 mg total) by mouth 2 (two) times daily as needed for muscle spasms. 12/08/17   Rise Mu, PA-C  diclofenac Sodium (VOLTAREN) 1 % GEL Apply 2 g topically 4 (four) times daily as needed (left arm pain). 08/10/21   Cristopher Peru, PA-C  dicyclomine  (BENTYL) 20 MG tablet Take 1 tablet (20 mg total) by mouth 2 (two) times daily. 02/07/18   Long, Arlyss Repress, MD  dorzolamide-timolol (COSOPT) 22.3-6.8 MG/ML ophthalmic solution Place 1 drop into the right eye 2 (two) times daily. 12/12/17   Mackuen, Courteney Lyn, MD  HYDROcodone-acetaminophen (NORCO/VICODIN) 5-325 MG per tablet Take 1-2 tablets by mouth every 4 (four) hours as needed for moderate pain. 08/23/14   Ivonne Andrew, PA-C  ibuprofen (ADVIL,MOTRIN) 600 MG tablet Take 1 tablet (600 mg total) by mouth every 6 (six) hours as needed. 10/27/18   Horton, Mayer Masker, MD  loperamide (IMODIUM) 2 MG capsule Take 1 capsule (2 mg total) by mouth 4 (four) times daily as needed for diarrhea or loose stools. 02/07/18   Long, Arlyss Repress, MD  methocarbamol (ROBAXIN) 500 MG tablet Take 1 tablet (500 mg total) by mouth as needed for muscle spasms. 08/10/21   Cristopher Peru, PA-C  methylPREDNISolone (MEDROL DOSEPAK) 4 MG TBPK tablet Take as directed 12/08/17   Demetrios Loll T, PA-C  omeprazole (PRILOSEC) 40 MG capsule Take 1 capsule (40 mg total) by mouth daily. Patient not taking: No sig reported 01/29/17   Saguier, Ramon Dredge, PA-C  ondansetron (ZOFRAN ODT) 4 MG disintegrating tablet Take 1 tablet (4 mg total) by mouth every 8 (eight) hours as needed for nausea or vomiting. 02/07/18   Long, Arlyss Repress, MD  ondansetron (ZOFRAN) 4 MG tablet Take 1 tablet (4 mg total) by mouth  every 6 (six) hours as needed for nausea or vomiting. Patient not taking: No sig reported 02/04/17   Unk Lightning, PA      Allergies    Anaprox [naproxen sodium] and Tramadol    Review of Systems   Review of Systems  Constitutional:  Negative for chills and fever.  Respiratory:  Negative for shortness of breath.   Cardiovascular:  Negative for chest pain.  Gastrointestinal:  Positive for nausea. Negative for abdominal pain, diarrhea and vomiting.  Genitourinary:  Positive for flank pain. Negative for dysuria, hematuria and vaginal  discharge.    Physical Exam Updated Vital Signs BP 111/84 (BP Location: Left Arm)   Pulse 77   Temp 97.8 F (36.6 C)   Resp 20   Ht 5\' 2"  (1.575 m)   Wt 59 kg   SpO2 96%   BMI 23.78 kg/m  Physical Exam Vitals and nursing note reviewed.  Constitutional:      General: She is not in acute distress.    Appearance: She is not ill-appearing.  HENT:     Head: Normocephalic.  Eyes:     Pupils: Pupils are equal, round, and reactive to light.  Cardiovascular:     Rate and Rhythm: Normal rate and regular rhythm.     Pulses: Normal pulses.     Heart sounds: Normal heart sounds. No murmur heard.    No friction rub. No gallop.  Pulmonary:     Effort: Pulmonary effort is normal.     Breath sounds: Normal breath sounds.  Abdominal:     General: Abdomen is flat. There is no distension.     Palpations: Abdomen is soft.     Tenderness: There is no abdominal tenderness. There is no guarding or rebound.  Musculoskeletal:        General: Normal range of motion.       Arms:     Cervical back: Neck supple.     Comments: Reproducible tenderness to left thoracic paraspinal region  Skin:    General: Skin is warm and dry.     Comments: No rash  Neurological:     General: No focal deficit present.     Mental Status: She is alert.  Psychiatric:        Mood and Affect: Mood normal.        Behavior: Behavior normal.     ED Results / Procedures / Treatments   Labs (all labs ordered are listed, but only abnormal results are displayed) Labs Reviewed  I-STAT CHEM 8, ED - Abnormal; Notable for the following components:      Result Value   TCO2 21 (*)    All other components within normal limits  HEPATIC FUNCTION PANEL  LIPASE, BLOOD  D-DIMER, QUANTITATIVE  CBC WITH DIFFERENTIAL/PLATELET  URINALYSIS, ROUTINE W REFLEX MICROSCOPIC    EKG EKG Interpretation Date/Time:  Sunday November 17 2023 17:43:51 EST Ventricular Rate:  65 PR Interval:  151 QRS Duration:  94 QT  Interval:  423 QTC Calculation: 440 R Axis:   57  Text Interpretation: Sinus rhythm Confirmed by Fulton Reek [phone]) on 11/17/2023 5:57:42 PM  Radiology DG Ribs Unilateral W/Chest Left  Result Date: 11/17/2023 CLINICAL DATA:  Pain.  No history of trauma EXAM: LEFT RIBS AND CHEST - 3 VIEW COMPARISON:  Chest x-ray two-view 01/20/2017 FINDINGS: No consolidation, pneumothorax or effusion. No edema. Normal cardiopericardial silhouette. Fixation hardware along the lower cervical spine at the edge of the imaging field. No left-sided rib fracture  identified by x-ray. IMPRESSION: No rib fracture seen on the left by x-ray. No pleural effusion or pneumothorax. Electronically Signed   By: Karen Kays M.D.   On: 11/17/2023 18:05    Procedures Procedures    Medications Ordered in ED Medications  lidocaine (LIDODERM) 5 % 1 patch (has no administration in time range)  acetaminophen (TYLENOL) tablet 650 mg (has no administration in time range)    ED Course/ Medical Decision Making/ A&P                                 Medical Decision Making Amount and/or Complexity of Data Reviewed Labs: ordered. Decision-making details documented in ED Course. Radiology: ordered and independent interpretation performed. Decision-making details documented in ED Course. ECG/medicine tests: ordered and independent interpretation performed. Decision-making details documented in ED Course.  Risk OTC drugs. Prescription drug management.   This patient presents to the ED for concern of left flank pain, this involves an extensive number of treatment options, and is a complaint that carries with it a high risk of complications and morbidity.  The differential diagnosis includes kidney stone, PNA, PE, MSK etiology, etc  62 year old female presents to the ED due to left-sided flank pain that started yesterday.  No history of kidney stones.  Pain worse with deep inspiration.  No history of blood clots.  Denies urinary  or vaginal symptoms.  No chest pain or shortness of breath.  Upon arrival, vitals all within normal limits.  Patient in no acute distress.  Reproducible tenderness throughout left thoracic paraspinal region.  No rash to suggest shingles.  Lungs clear to auscultation bilaterally.  Routine labs ordered.  Chest x-ray to rule out evidence of lower lobe pneumonia.  D-dimer to rule out PE given pleuritic nature of pain.  If d-dimer normal, will obtain CT renal study to rule out kidney stone. Possible MSK etiology given reproducible nature on exam? Discussed with Dr. Earlene Plater who evaluated patient at bedside and agrees with assessment and plan. Lower suspicion for ACS given no chest pain.   CBC unremarkable.  No leukocytosis.  Normal hemoglobin.  Hepatic function panel normal.  Lipase normal.  Low suspicion for pancreatitis.  Chem-8 with normal renal function.  No major electrolyte derangements.  D-dimer normal.  Low suspicion for PE.  EKG demonstrates normal sinus rhythm.  No signs of acute ischemia.  Low suspicion for ACS.  Chest x-ray personally reviewed and interpreted negative for any acute abnormalities.  No evidence of pneumonia.  No rib fractures.  CT renal study personally reviewed and interpreted which demonstrates no evidence of kidney stone.  Does demonstrate a partially imaged superficial subcutaneous soft tissue density possibly an inclusion cyst. No erythema on exam to suggest abscess that warrants I&D. Cyst could be causing pain vs. MSK etiology. Patient discharged with Robaxin and advised to continue her pain medication as previously prescribed. Patient stable for discharge. Strict ED precautions discussed with patient. Patient states understanding and agrees to plan. Patient discharged home in no acute distress and stable vitals  Co morbidities that complicate the patient evaluation  Chronic pain Cardiac Monitoring: / EKG:  The patient was maintained on a cardiac monitor.  I personally viewed and  interpreted the cardiac monitored which showed an underlying rhythm of: NSR   Test / Admission - Considered:  CTA chest; however d-dimer normal so low suspicion for PE        Final Clinical Impression(s) /  ED Diagnoses Final diagnoses:  Flank pain    Rx / DC Orders ED Discharge Orders          Ordered    methocarbamol (ROBAXIN) 500 MG tablet  2 times daily        11/17/23 1817              Jesusita Oka 11/17/23 1849    Laurence Spates, MD 11/18/23 [phone]
